# Patient Record
Sex: Female | Born: 1965 | Race: White | Hispanic: No | Marital: Married | Smoking: Never smoker
Health system: Southern US, Community
[De-identification: ages and names within clinical notes are randomized; demographics above are authoritative.]

## PROBLEM LIST (undated history)

## (undated) DIAGNOSIS — M6281 Muscle weakness (generalized): Secondary | ICD-10-CM

## (undated) DIAGNOSIS — K429 Umbilical hernia without obstruction or gangrene: Secondary | ICD-10-CM

## (undated) DIAGNOSIS — T7840XA Allergy, unspecified, initial encounter: Secondary | ICD-10-CM

## (undated) DIAGNOSIS — R42 Dizziness and giddiness: Secondary | ICD-10-CM

## (undated) DIAGNOSIS — E039 Hypothyroidism, unspecified: Secondary | ICD-10-CM

## (undated) DIAGNOSIS — J329 Chronic sinusitis, unspecified: Secondary | ICD-10-CM

## (undated) DIAGNOSIS — I8390 Asymptomatic varicose veins of unspecified lower extremity: Secondary | ICD-10-CM

## (undated) DIAGNOSIS — J42 Unspecified chronic bronchitis: Secondary | ICD-10-CM

## (undated) DIAGNOSIS — M79606 Pain in leg, unspecified: Secondary | ICD-10-CM

## (undated) DIAGNOSIS — R519 Headache, unspecified: Secondary | ICD-10-CM

## (undated) DIAGNOSIS — M436 Torticollis: Secondary | ICD-10-CM

## (undated) DIAGNOSIS — R202 Paresthesia of skin: Secondary | ICD-10-CM

## (undated) DIAGNOSIS — G40909 Epilepsy, unspecified, not intractable, without status epilepticus: Secondary | ICD-10-CM

## (undated) HISTORY — DX: Allergy, unspecified, initial encounter: T78.40XA

## (undated) HISTORY — DX: Epilepsy, unspecified, not intractable, without status epilepticus: G40.909

## (undated) HISTORY — DX: Chronic sinusitis, unspecified: J32.9

## (undated) HISTORY — DX: Umbilical hernia without obstruction or gangrene: K42.9

## (undated) HISTORY — DX: Asymptomatic varicose veins of unspecified lower extremity: I83.90

## (undated) HISTORY — DX: Dizziness and giddiness: R42

## (undated) HISTORY — DX: Unspecified chronic bronchitis: J42

## (undated) HISTORY — DX: Paresthesia of skin: R20.2

## (undated) HISTORY — DX: Headache, unspecified: R51.9

## (undated) HISTORY — DX: Hypothyroidism, unspecified: E03.9

## (undated) HISTORY — DX: Pain in leg, unspecified: M79.606

## (undated) HISTORY — DX: Muscle weakness (generalized): M62.81

## (undated) HISTORY — DX: Torticollis: M43.6

---
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
## 2014-07-03 DIAGNOSIS — Z8659 Personal history of other mental and behavioral disorders: Secondary | ICD-10-CM

## 2014-07-03 DIAGNOSIS — F411 Generalized anxiety disorder: Secondary | ICD-10-CM | POA: Insufficient documentation

## 2016-02-19 DIAGNOSIS — H524 Presbyopia: Secondary | ICD-10-CM | POA: Diagnosis not present

## 2016-05-21 DIAGNOSIS — R1011 Right upper quadrant pain: Secondary | ICD-10-CM | POA: Diagnosis not present

## 2016-05-21 DIAGNOSIS — R1013 Epigastric pain: Secondary | ICD-10-CM | POA: Diagnosis not present

## 2016-05-21 DIAGNOSIS — R110 Nausea: Secondary | ICD-10-CM | POA: Diagnosis not present

## 2016-05-21 DIAGNOSIS — K529 Noninfective gastroenteritis and colitis, unspecified: Secondary | ICD-10-CM | POA: Diagnosis not present

## 2016-05-21 DIAGNOSIS — K2901 Acute gastritis with bleeding: Secondary | ICD-10-CM | POA: Diagnosis not present

## 2016-05-21 DIAGNOSIS — K2900 Acute gastritis without bleeding: Secondary | ICD-10-CM | POA: Diagnosis not present

## 2016-05-21 DIAGNOSIS — R630 Anorexia: Secondary | ICD-10-CM | POA: Diagnosis not present

## 2016-05-21 DIAGNOSIS — E039 Hypothyroidism, unspecified: Secondary | ICD-10-CM | POA: Diagnosis not present

## 2019-05-17 ENCOUNTER — Ambulatory Visit (HOSPITAL_COMMUNITY)
Admission: RE | Admit: 2019-05-17 | Discharge: 2019-05-17 | Disposition: A | Discharge status: Home / Self Care | Payer: No Typology Code available for payment source | Source: Ambulatory Visit | Attending: Family | Admitting: Family

## 2019-05-17 ENCOUNTER — Other Ambulatory Visit: Payer: Self-pay

## 2019-05-17 ENCOUNTER — Other Ambulatory Visit (HOSPITAL_COMMUNITY): Payer: Self-pay | Admitting: Family

## 2019-05-17 ENCOUNTER — Other Ambulatory Visit: Payer: Self-pay | Admitting: Family

## 2019-05-17 ENCOUNTER — Encounter (HOSPITAL_COMMUNITY): Payer: Self-pay

## 2019-05-17 ENCOUNTER — Encounter (INDEPENDENT_AMBULATORY_CARE_PROVIDER_SITE_OTHER): Payer: Self-pay

## 2019-05-17 DIAGNOSIS — R519 Headache, unspecified: Secondary | ICD-10-CM

## 2019-05-17 DIAGNOSIS — R52 Pain, unspecified: Secondary | ICD-10-CM

## 2019-05-17 NOTE — Progress Notes (Signed)
VASCULAR LAB PRELIMINARY  PRELIMINARY  PRELIMINARY  PRELIMINARY  Bilateral lower extremity venous duplex completed.    Preliminary report:  See CV proc for preliminary results.   Arletta Lumadue, RVT 05/17/2019, 2:28 PM

## 2019-05-23 ENCOUNTER — Ambulatory Visit (HOSPITAL_COMMUNITY)
Admission: RE | Admit: 2019-05-23 | Discharge: 2019-05-23 | Disposition: A | Discharge status: Home / Self Care | Payer: No Typology Code available for payment source | Source: Ambulatory Visit | Attending: Family | Admitting: Family

## 2019-05-23 ENCOUNTER — Other Ambulatory Visit: Payer: Self-pay

## 2019-05-23 DIAGNOSIS — R51 Headache: Secondary | ICD-10-CM | POA: Diagnosis not present

## 2019-05-23 DIAGNOSIS — R519 Headache, unspecified: Secondary | ICD-10-CM

## 2019-05-25 ENCOUNTER — Encounter: Payer: Self-pay | Admitting: Vascular Surgery

## 2019-06-08 ENCOUNTER — Other Ambulatory Visit: Payer: Self-pay

## 2019-06-08 DIAGNOSIS — I83893 Varicose veins of bilateral lower extremities with other complications: Secondary | ICD-10-CM

## 2019-06-13 ENCOUNTER — Telehealth (HOSPITAL_COMMUNITY): Payer: Self-pay

## 2019-06-13 NOTE — Telephone Encounter (Signed)
The above patient or their representative was contacted and gave the following answers to these questions:         Do you have any of the following symptoms? No  Fever                    Cough                   Shortness of breath  Do  you have any of the following other symptoms?  No   muscle pain         vomiting,        diarrhea        rash         weakness        red eye        abdominal pain         bruising          bruising or bleeding              joint pain           severe headache    Have you been in contact with someone who was or has been sick in the past 2 weeks? No  Yes                 Unsure                         Unable to assess   Does the person that you were in contact with have any of the following symptoms? N/A   Cough         shortness of breath           muscle pain         vomiting,            diarrhea            rash            weakness           fever            red eye           abdominal pain           bruising  or  bleeding                joint pain                severe headache                COMMENTS OR ACTION PLAN FOR THIS PATIENT:         

## 2019-06-14 ENCOUNTER — Ambulatory Visit (INDEPENDENT_AMBULATORY_CARE_PROVIDER_SITE_OTHER): Payer: No Typology Code available for payment source | Admitting: Vascular Surgery

## 2019-06-14 ENCOUNTER — Other Ambulatory Visit: Payer: Self-pay

## 2019-06-14 ENCOUNTER — Ambulatory Visit (HOSPITAL_COMMUNITY)
Admission: RE | Admit: 2019-06-14 | Discharge: 2019-06-14 | Disposition: A | Discharge status: Home / Self Care | Payer: No Typology Code available for payment source | Source: Ambulatory Visit | Attending: Family | Admitting: Family

## 2019-06-14 ENCOUNTER — Encounter: Payer: Self-pay | Admitting: Vascular Surgery

## 2019-06-14 VITALS — BP 128/80 | HR 77 | Temp 97.3°F | Resp 18 | Ht 64.5 in | Wt 208.8 lb

## 2019-06-14 DIAGNOSIS — I83893 Varicose veins of bilateral lower extremities with other complications: Secondary | ICD-10-CM | POA: Insufficient documentation

## 2019-06-14 DIAGNOSIS — I83813 Varicose veins of bilateral lower extremities with pain: Secondary | ICD-10-CM | POA: Diagnosis not present

## 2019-06-14 NOTE — Progress Notes (Signed)
REASON FOR CONSULT:    Painful varicose veins bilaterally.  The consult is requested by Karen Krause  ASSESSMENT & PLAN:   PAINFUL VARICOSE VEINS BILATERALLY: This patient has CEAP C2 venous disease.  She has painful varicose veins of both lower extremities.  She has reflux in both great saphenous veins and dilated varicose veins in both legs that are under significant pressure.  We have discussed the importance of intermittent leg elevation and the proper positioning for this.  In addition I have given her a prescription for thigh-high compression stockings with a gradient of 20 to 30 mmHg.  I have encouraged her to avoid prolonged sitting and standing.  We then discussed the importance of exercise specifically walking and water aerobics.  We also discussed the importance of weight management.  I will plan on seeing her back in 3 months.  If she fails conservative treatment I think she would be a reasonable candidate for laser ablation of the great saphenous vein in the left proximal thigh along with stab phlebectomies on the left.  She could have staged laser ablation of the right great saphenous vein with stab phlebectomies also.  1 leg is not significantly more symptomatic than the other.  I will plan on seeing her back in 3 months.  She knows to call sooner if she has problems.   Karen Mayo, MD, FACS Beeper [phone] Office: [phone]   HPI:   Karen Krause is a pleasant 53 y.o. female, who is referred with painful varicose veins bilaterally.  I did review the notes from the referring office.  The patient was seen on 05/17/2019.  At that time the patient was having a headache.  The patient was also complaining of some right lower extremity pain.  This prompted a venous duplex scan which was done on 05/17/2019.  This showed no evidence of deep venous thrombosis on either side.  Patient was noted to have significant varicose veins bilaterally.  She was sent for vascular consultation.   The patient has a long history of varicose veins of both lower extremities.  She actually began having issues with varicose veins in her teens.  She was gardening a lot more in June and started having severe symptoms in her legs related to venous hypertension.  She describes aching pain burning and heaviness in her legs which is aggravated by sitting and standing and relieved somewhat with elevation.  She has not worn compression stockings.  She does not like to take ibuprofen.  She is unaware of any previous history of DVT.  She has had phlebitis in the past.  Past Medical History:  Diagnosis Date  . Allergy   . Chronic bronchitis ([place])   . Dizziness   . Frequent episodes of sinusitis   . Headache   . Hypothyroidism   . Leg pain    Bilateral  . Muscle weakness   . Seizure disorder ([place])   . Stiff neck   . Tingling sensation   . Umbilical hernia   . Varicose vein of leg   . Varicosities of leg     Family History  Problem Relation Age of Onset  . Dementia Mother   . Cancer Father   . Cancer Maternal Grandmother   . Dementia Paternal Grandmother     SOCIAL HISTORY: Social History   Socioeconomic History  . Marital status: Married    Spouse name: Not on file  . Number of children: Not on file  . Years of education: Not  on file  . Highest education level: Not on file  Occupational History  . Occupation: Arts development officerHome Maker  Social Needs  . Financial resource strain: Not on file  . Food insecurity    Worry: Not on file    Inability: Not on file  . Transportation needs    Medical: Not on file    Non-medical: Not on file  Tobacco Use  . Smoking status: Never Smoker  . Smokeless tobacco: Never Used  Substance and Sexual Activity  . Alcohol use: Never    Frequency: Never  . Drug use: Not on file  . Sexual activity: Not on file  Lifestyle  . Physical activity    Days per week: Not on file    Minutes per session: Not on file  . Stress: Not on file  Relationships  . Social  Musicianconnections    Talks on phone: Not on file    Gets together: Not on file    Attends religious service: Not on file    Active member of club or organization: Not on file    Attends meetings of clubs or organizations: Not on file    Relationship status: Not on file  . Intimate partner violence    Fear of current or ex partner: Not on file    Emotionally abused: Not on file    Physically abused: Not on file    Forced sexual activity: Not on file  Other Topics Concern  . Not on file  Social History Narrative  . Not on file    Allergies  Allergen Reactions  . Codeine     Severe headaches, hallucinations, fever    Current Outpatient Medications  Medication Sig Dispense Refill  . fluticasone (FLONASE) 50 MCG/ACT nasal spray      No current facility-administered medications for this visit.     REVIEW OF SYSTEMS:  [X]  denotes positive finding, [ ]  denotes negative finding Cardiac  Comments:  Chest pain or chest pressure: x   Shortness of breath upon exertion: x   Short of breath when lying flat: x   Irregular heart rhythm:        Vascular    Pain in calf, thigh, or hip brought on by ambulation: x   Pain in feet at night that wakes you up from your sleep:     Blood clot in your veins:    Leg swelling:  x       Pulmonary    Oxygen at home:    Productive cough:     Wheezing:         Neurologic    Sudden weakness in arms or legs:     Sudden numbness in arms or legs:     Sudden onset of difficulty speaking or slurred speech:    Temporary loss of vision in one eye:     Problems with dizziness:  x       Gastrointestinal    Blood in stool:     Vomited blood:         Genitourinary    Burning when urinating:     Blood in urine:        Psychiatric    Major depression:         Hematologic    Bleeding problems:    Problems with blood clotting too easily:        Skin    Rashes or ulcers:        Constitutional    Fever or  chills:     PHYSICAL EXAM:   Vitals:    06/14/19 1421  BP: 128/80  Pulse: 77  Resp: 18  Temp: (!) 97.3 F (36.3 C)  TempSrc: Temporal  SpO2: 99%  Weight: 208 lb 12.8 oz (94.7 kg)  Height: 5' 4.5" (1.638 m)    GENERAL: The patient is a well-nourished female, in no acute distress. The vital signs are documented above. CARDIAC: There is a regular rate and rhythm.  VASCULAR: I do not detect carotid bruits. She has palpable pedal pulses. VENOUS EXAM: She has varicose veins of both lower extremities which are under significant pressure.  RIGHT LEG:   LEFT LEG:   Posterior:   I did look at her saphenous veins myself with the SonoSite.  On the right side the vein was dilated beginning in the mid thigh to proximal third of the thigh.  At this level it was feeding a large cluster of varicose veins.  Below that the vein was small.  On the left side, the proximal saphenous vein was significantly dilated.  Looks like this could be cannulated in the proximal third of the thigh.  PULMONARY: There is good air exchange bilaterally without wheezing or rales. ABDOMEN: Soft and non-tender with normal pitched bowel sounds.  MUSCULOSKELETAL: There are no major deformities or cyanosis. NEUROLOGIC: No focal weakness or paresthesias are detected. SKIN: There are no ulcers or rashes noted. PSYCHIATRIC: The patient has a normal affect.  DATA:    VENOUS DUPLEX: I have independently interpreted her venous duplex scan today.  On the right side there is no evidence of deep venous thrombosis.  There is deep venous reflux involving the common femoral vein.  There is superficial venous reflux involving the right great saphenous vein which is dilated with diameters up to 0.62 cm in the thigh and 0.53 cm at the knee.  There is reflux in the right small saphenous vein with this vein is not significantly dilated.  On the left side there is no evidence of DVT.  There is deep venous reflux involving the common femoral vein.  There is superficial  venous reflux in the proximal great saphenous vein which is significantly dilated with diameters ranging from 0.64-0.74 cm.

## 2019-09-13 ENCOUNTER — Ambulatory Visit: Payer: No Typology Code available for payment source | Admitting: Vascular Surgery

## 2020-02-14 ENCOUNTER — Ambulatory Visit (INDEPENDENT_AMBULATORY_CARE_PROVIDER_SITE_OTHER): Payer: No Typology Code available for payment source | Admitting: Vascular Surgery

## 2020-02-14 ENCOUNTER — Encounter: Payer: Self-pay | Admitting: Vascular Surgery

## 2020-02-14 ENCOUNTER — Other Ambulatory Visit: Payer: Self-pay

## 2020-02-14 VITALS — BP 130/82 | HR 72 | Temp 97.8°F | Resp 18 | Ht 64.75 in | Wt 213.5 lb

## 2020-02-14 DIAGNOSIS — I83893 Varicose veins of bilateral lower extremities with other complications: Secondary | ICD-10-CM

## 2020-02-14 DIAGNOSIS — I83813 Varicose veins of bilateral lower extremities with pain: Secondary | ICD-10-CM

## 2020-02-14 NOTE — Progress Notes (Signed)
Patient name: Karen Krause MRN: [id number] DOB: 03-15-1966 Sex: female  REASON FOR VISIT:   Follow-up of painful varicose veins.  HPI:   Karen Krause is a pleasant 54 y.o. female who I saw in consultation on 06/14/2019.  She had painful varicose veins of both lower extremities.  She had CEAP C2 venous disease.  She had reflux in both great saphenous veins which were dilated and under significant pressure.  We discussed the importance of intermittent leg elevation, thigh-high compression stockings, and ibuprofen as needed for pain.  I felt that if she failed conservative treatment she would be a reasonable candidate for laser ablation of the great saphenous vein in the left proximal thigh along with stab phlebectomies on the left.  She could then have staged laser ablation of the right great saphenous vein with stab phlebectomies.  She comes in for routine follow-up visit.  Since I saw her last she is continuing to have significant pain in both lower extremities.  Her symptoms are aggravated by standing and are worse at the end of the day.  She has been elevating her legs.  She has been wearing her thigh-high stockings although they rolled down some and lately have become more uncomfortable.  She takes ibuprofen as needed for pain.  She did fall down the stairs and bruised her ribs but is recovering from this.  Past Medical History:  Diagnosis Date  . Allergy   . Chronic bronchitis ([place])   . Dizziness   . Frequent episodes of sinusitis   . Headache   . Hypothyroidism   . Leg pain    Bilateral  . Muscle weakness   . Seizure disorder ([place])   . Stiff neck   . Tingling sensation   . Umbilical hernia   . Varicose vein of leg   . Varicosities of leg     Family History  Problem Relation Age of Onset  . Dementia Mother   . Cancer Father   . Cancer Maternal Grandmother   . Dementia Paternal Grandmother     SOCIAL HISTORY: Social History   Tobacco Use  . Smoking status: Never  Smoker  . Smokeless tobacco: Never Used  Substance Use Topics  . Alcohol use: Never    Allergies  Allergen Reactions  . Codeine     Severe headaches, hallucinations, fever    Current Outpatient Medications  Medication Sig Dispense Refill  . fluticasone (FLONASE) 50 MCG/ACT nasal spray      No current facility-administered medications for this visit.    REVIEW OF SYSTEMS:  [X]  denotes positive finding, [ ]  denotes negative finding Cardiac  Comments:  Chest pain or chest pressure:    Shortness of breath upon exertion:    Short of breath when lying flat:    Irregular heart rhythm:        Vascular    Pain in calf, thigh, or hip brought on by ambulation:    Pain in feet at night that wakes you up from your sleep:     Blood clot in your veins:    Leg swelling:         Pulmonary    Oxygen at home:    Productive cough:     Wheezing:         Neurologic    Sudden weakness in arms or legs:     Sudden numbness in arms or legs:     Sudden onset of difficulty speaking or slurred speech:    Temporary  loss of vision in one eye:     Problems with dizziness:         Gastrointestinal    Blood in stool:     Vomited blood:         Genitourinary    Burning when urinating:     Blood in urine:        Psychiatric    Major depression:         Hematologic    Bleeding problems:    Problems with blood clotting too easily:        Skin    Rashes or ulcers:        Constitutional    Fever or chills:     PHYSICAL EXAM:   Vitals:   02/14/20 0847  BP: 130/82  Pulse: 72  Resp: 18  Temp: 97.8 F (36.6 C)  TempSrc: Temporal  SpO2: 99%  Weight: 213 lb 8 oz (96.8 kg)  Height: 5' 4.75" (1.645 m)    GENERAL: The patient is a well-nourished female, in no acute distress. The vital signs are documented above. CARDIAC: There is a regular rate and rhythm.  VASCULAR: She has significant varicose veins in her thighs and calves bilaterally as documented on her previous note.  These  are under significantly elevated pressures. I looked at both great saphenous veins myself with the SonoSite.  She has incompetence in the proximal thigh on both sides and the veins are significantly dilated.  These feed these large clusters of varicose veins bilaterally. PULMONARY: There is good air exchange bilaterally without wheezing or rales. ABDOMEN: Soft and non-tender with normal pitched bowel sounds.  MUSCULOSKELETAL: There are no major deformities or cyanosis. NEUROLOGIC: No focal weakness or paresthesias are detected. SKIN: There are no ulcers or rashes noted. PSYCHIATRIC: The patient has a normal affect.  DATA:    No new data  MEDICAL ISSUES:   PAINFUL VARICOSE VEINS BILATERALLY: This patient has continued significant painful varicose veins despite conservative treatment.  I think she is a good candidate for staged laser ablation of both great saphenous veins with stab phlebectomies.  We would start on the left side where I would ablate the vein to the junction of the proximal and middle third of the thigh.  She would require greater than 20 stab phlebectomies on the left.  When she is recovered from that she would be a candidate for laser ablation of the right great saphenous vein again down to the junction of the proximal and middle third of the thigh with greater than 20 stab phlebectomies.  We will schedule this in the near future.  I have discussed the indications for endovenous laser ablation of the right GSV, that is to lower the pressure in the veins and potentially help relieve the symptoms from venous hypertension. I have also discussed alternative options including conservative treatment with leg elevation, compression therapy, exercise, avoiding prolonged sitting and standing, and weight management. I have discussed the potential complications of the procedure, including, but not limited to: bleeding, bruising, leg swelling, nerve injury, skin burns, significant pain from  phlebitis, deep venous thrombosis, or failure of the vein to close.  I have also explained that venous insufficiency is a chronic disease, and that the patient is at risk for recurrent varicose veins in the future.  All of the patient's questions were encouraged and answered. They are agreeable to proceed.   I have discussed with the patient the indications for stab phlebectomy.  I have explained to  the patient that that will have small scars from the stab incisions.  I explained that the other risks include leg swelling, bruising, bleeding, and phlebitis.  All the patient's questions were encouraged and answered and they are agreeable to proceed.   Waverly Ferrari [facility] [phone]

## 2020-02-20 ENCOUNTER — Other Ambulatory Visit: Payer: Self-pay | Admitting: *Deleted

## 2020-02-20 DIAGNOSIS — I83813 Varicose veins of bilateral lower extremities with pain: Secondary | ICD-10-CM

## 2020-03-13 ENCOUNTER — Encounter: Payer: Self-pay | Admitting: Vascular Surgery

## 2020-03-14 IMAGING — CT CT HEAD WITHOUT CONTRAST
3 series · 15 of 47 positions shown, 18 images · non-contrast
Comparison: July 03, 2014

CLINICAL DATA: Headache and left frontal region numbness

EXAM:
CT HEAD WITHOUT CONTRAST
TECHNIQUE: Contiguous axial images were obtained from the base of the skull
through the vertex without intravenous contrast.

[Series 3: head 5.0 h30s · axial · 0.43mm/px · z∈[-110,+25]mm · 9 of 33 slices shown, 12 images]
[im 3/33  brain]
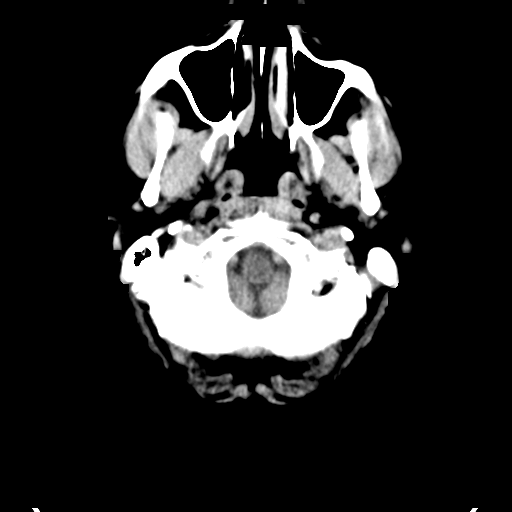
[im 3/33  bone]
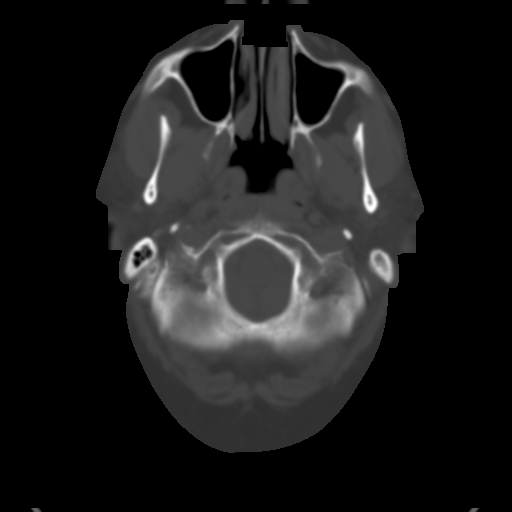
[im 6/33  brain]
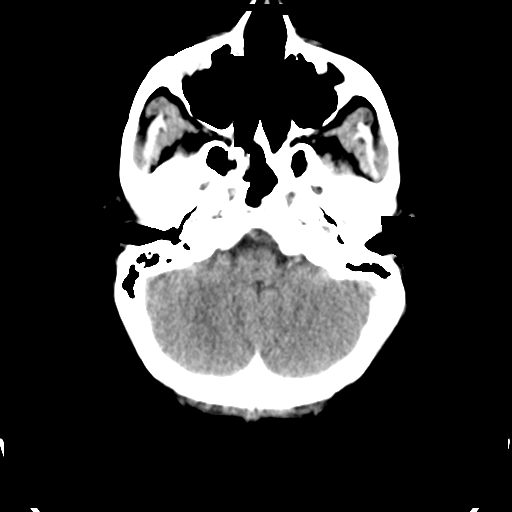
[im 9/33  brain]
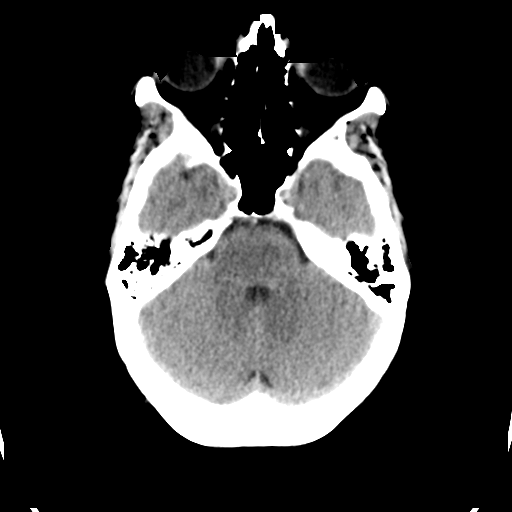
[im 13/33  brain]
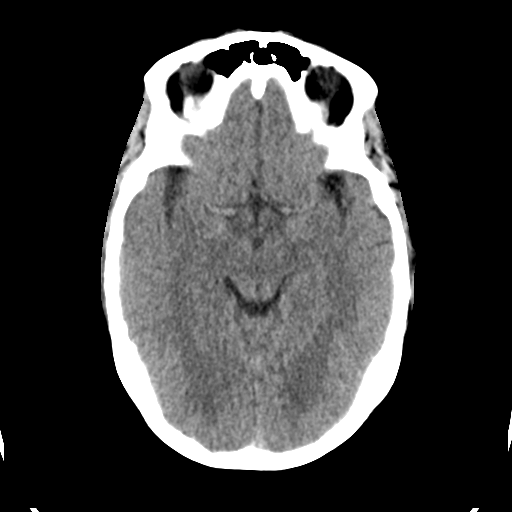
[im 17/33  brain]
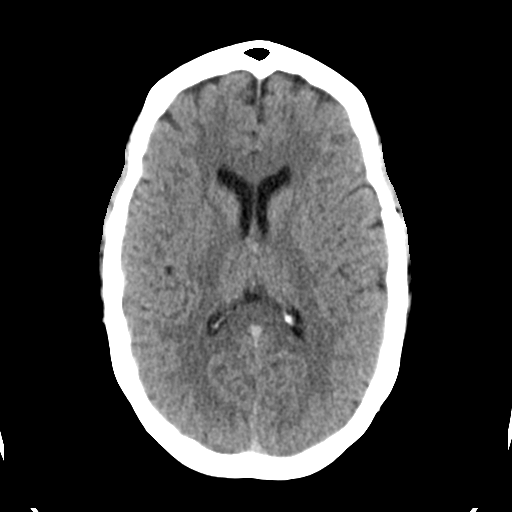
[im 17/33  bone]
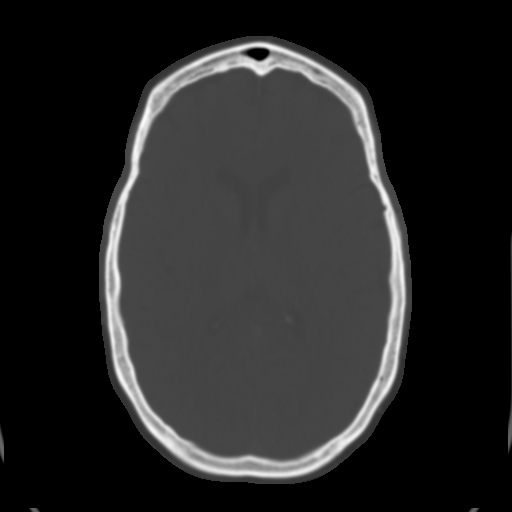
[im 20/33  brain]
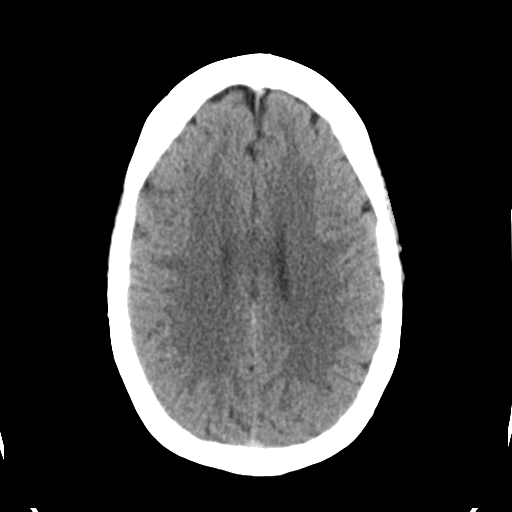
[im 24/33  brain]
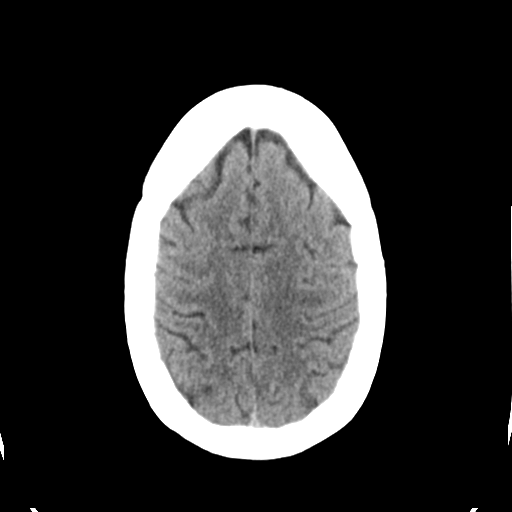
[im 27/33  brain]
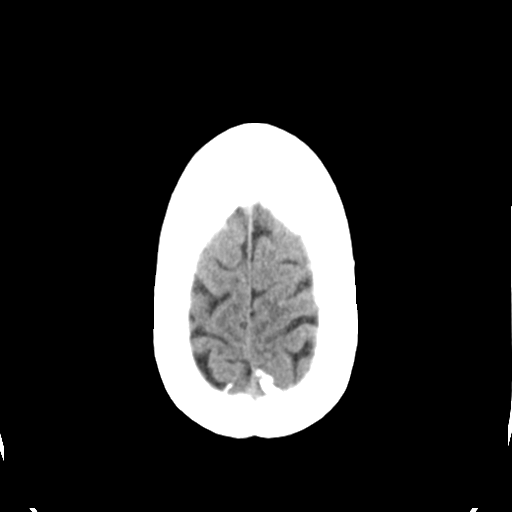
[im 30/33  brain]
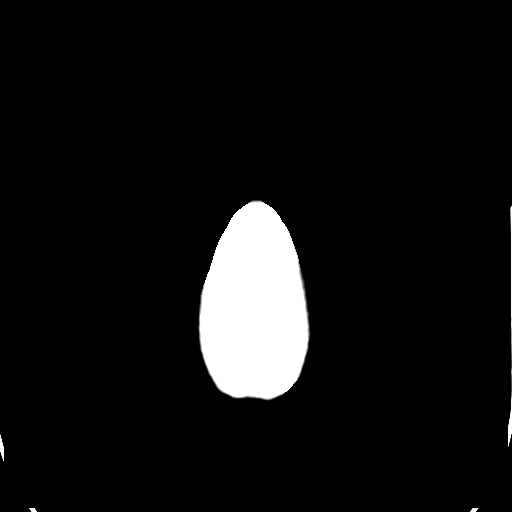
[im 30/33  bone]
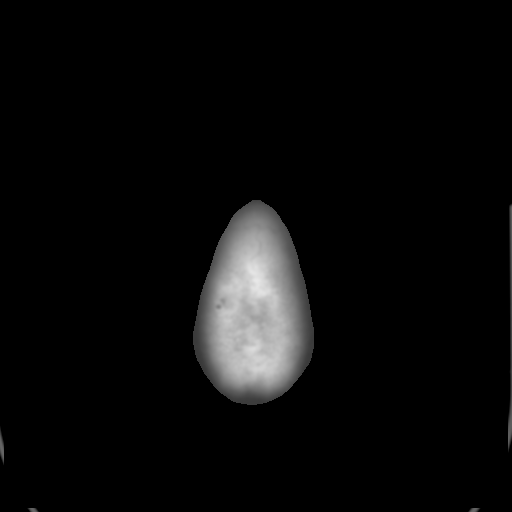

[Series 5: head 3.0 mpr cor · coronal · 0.31mm/px · 3 of 67 slices shown]
[im 23/67  brain]
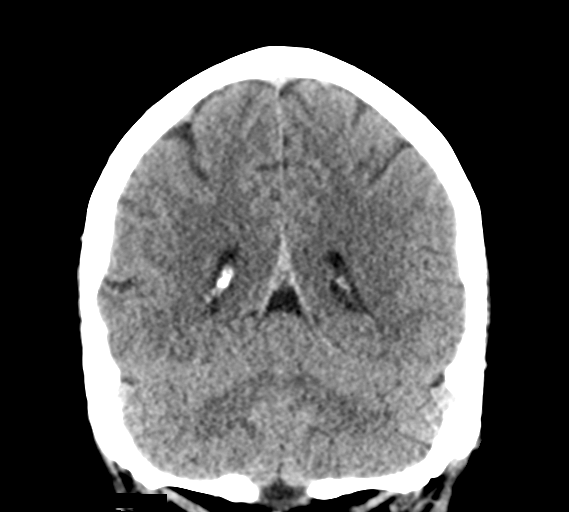
[im 30/67  brain]
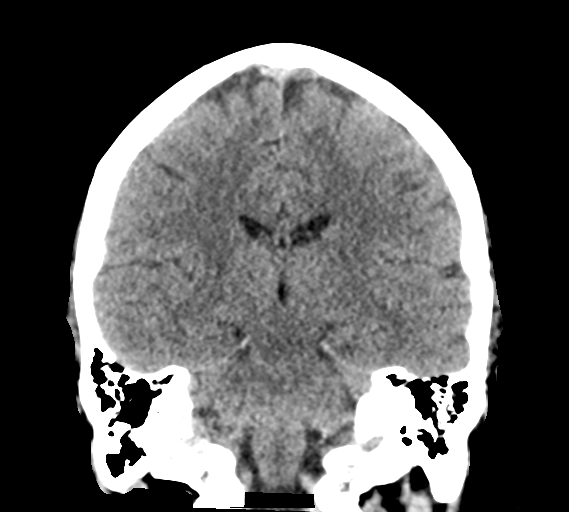
[im 37/67  brain]
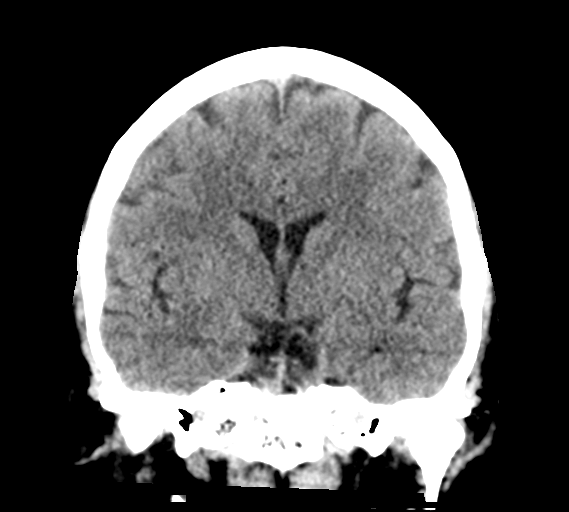

[Series 6: head 3.0 mpr sag · sagittal · 0.31mm/px · 3 of 55 slices shown]
[im 19/55  brain]
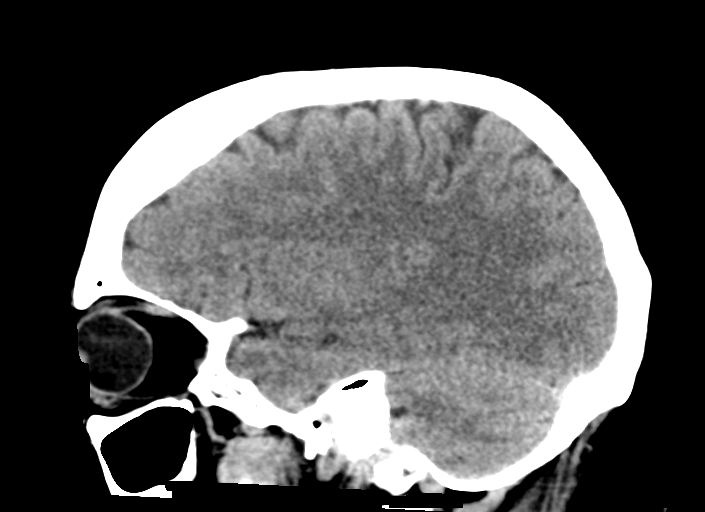
[im 28/55  brain]
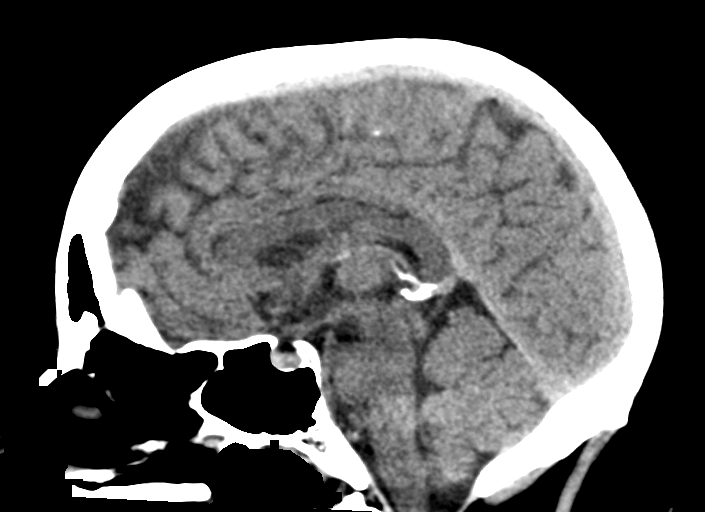
[im 37/55  brain]
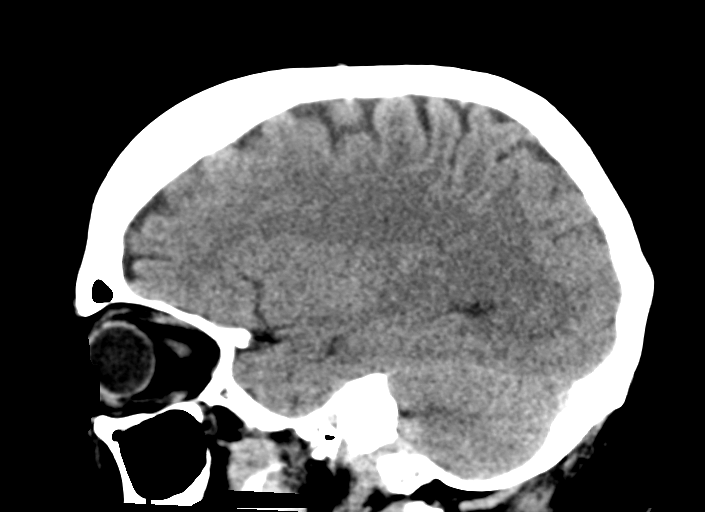

[15 of 47 positions shown; findings below may reference images not displayed]

FINDINGS: Brain: Ventricles are normal in size and configuration. There is no
intracranial mass, hemorrhage, extra-axial fluid collection, or
midline shift. Brain parenchyma appears unremarkable. No evident
acute infarct.

Vascular: No hyperdense vessel.  No evident vascular calcification.

Skull: Bony calvarium appears intact.

Sinuses/Orbits: There is mild mucosal thickening in several ethmoid
air cells. Other visualized paranasal sinuses are clear. Orbits
appear symmetric bilaterally.

Other: Mastoid air cells are clear.
IMPRESSION: Slight ethmoid sinus disease.  Study otherwise unremarkable.

## 2020-03-20 ENCOUNTER — Other Ambulatory Visit: Payer: Self-pay

## 2020-03-20 ENCOUNTER — Ambulatory Visit (INDEPENDENT_AMBULATORY_CARE_PROVIDER_SITE_OTHER): Payer: No Typology Code available for payment source | Admitting: Vascular Surgery

## 2020-03-20 ENCOUNTER — Encounter: Payer: Self-pay | Admitting: Vascular Surgery

## 2020-03-20 VITALS — BP 146/92 | HR 79 | Temp 97.6°F | Resp 16 | Ht 64.5 in | Wt 213.0 lb

## 2020-03-20 DIAGNOSIS — I83813 Varicose veins of bilateral lower extremities with pain: Secondary | ICD-10-CM

## 2020-03-20 DIAGNOSIS — I8393 Asymptomatic varicose veins of bilateral lower extremities: Secondary | ICD-10-CM

## 2020-03-20 HISTORY — PX: ENDOVENOUS ABLATION SAPHENOUS VEIN W/ LASER: SUR449

## 2020-03-20 NOTE — Progress Notes (Signed)
     Laser Ablation Procedure    Date: 03/20/2020   Karen Krause DOB:[birth year]  Consent signed: Yes Cari Caraway MD    Surgeon:   Procedure: Laser Ablation: left Greater Saphenous Vein  BP (!) 146/92 (BP Location: Right Arm, Patient Position: Sitting, Cuff Size: Normal)   Pulse 79   Temp 97.6 F (36.4 C) (Temporal)   Resp 16   Ht 5' 4.5" (1.638 m)   Wt 213 lb (96.6 kg)   SpO2 99%   BMI 36.00 kg/m   Tumescent Anesthesia: 475  cc 0.9% NaCl with 50 cc Lidocaine HCL 1%  and 15 cc 8.4% NaHCO3  Local Anesthesia: 6 cc Lidocaine HCL and NaHCO3 (ratio 2:1)  7 watts continuous mode Total energy: 441.6 Joules  Total Time 63 seconds Treatment Length 11 cm  Laser Fiber Ref. # [id number]      Lot # [id number]   Stab Phlebectomy: >20 Sites: Thigh and Calf  Patient tolerated procedure well  Notes: Patient wore face mask.  All staff members wore facial masks and facial shields/goggles.    Description of Procedure:  After marking the course of the secondary varicosities, the patient was placed on the operating table in the supine position, and the left leg was prepped and draped in sterile fashion.   Local anesthetic was administered and under ultrasound guidance the saphenous vein was accessed with a micro needle and guide wire; then the mirco puncture sheath was placed.  A guide wire was inserted saphenofemoral junction , followed by a 5 french sheath.  The position of the sheath and then the laser fiber below the junction was confirmed using the ultrasound.  Tumescent anesthesia was administered along the course of the saphenous vein using ultrasound guidance. The patient was placed in Trendelenburg position and protective laser glasses were placed on patient and staff, and the laser was fired at 7 watts continuous mode for a total of 441.6 joules.   For stab phlebectomies, local anesthetic was administered at the previously marked varicosities, and tumescent anesthesia was administered  around the vessels.  Greater than 20 stab wounds were made using the tip of an 11 blade. And using the vein hook, the phlebectomies were performed using a hemostat to avulse the varicosities.  Adequate hemostasis was achieved.     Steri strips were applied to the stab wounds and ABD pads and thigh high compression stockings were applied.  Ace wrap bandages were applied over the phlebectomy sites and at the top of the saphenofemoral junction. Blood loss was less than 15 cc.  Discharge instructions reviewed with patient and hardcopy of discharge instructions given to patient to take home. The patient ambulated out of the operating room having tolerated the procedure well.

## 2020-03-20 NOTE — Progress Notes (Signed)
   Patient name: Karen Krause MRN: [id number] DOB: [birth year] Sex: female  REASON FOR VISIT: For laser ablation of the left great saphenous vein with greater than 20 stab phlebectomies.  HPI: Karen Krause is a 54 y.o. female who had presented with painful varicose veins bilaterally.  She had failed conservative treatment and was felt to be a candidate for staged laser ablation of the left great saphenous veins with stab phlebectomies.  She comes in today for treatment of the left side.  Current Outpatient Medications  Medication Sig Dispense Refill  . fluticasone (FLONASE) 50 MCG/ACT nasal spray      No current facility-administered medications for this visit.    PHYSICAL EXAM: Vitals:   03/20/20 0840  BP: (!) 146/92  Pulse: 79  Resp: 16  Temp: 97.6 F (36.4 C)  TempSrc: Temporal  SpO2: 99%  Weight: 213 lb (96.6 kg)  Height: 5' 4.5" (1.638 m)   Body mass index is 36 kg/m.  PROCEDURE: Laser ablation left great saphenous vein with greater than 20 stab phlebectomies  TECHNIQUE: The patient was taken to the exam room and the dilated veins in the left leg were marked with the patient standing.  The patient was then placed supine.  After careful positioning I examined the left great saphenous vein with the SonoSite and I felt that I could cannulate this in the junction of the proximal and middle thigh.  The left leg was prepped and draped in usual sterile fashion.  Under ultrasound guidance, after the skin was anesthetized, I cannulated the saphenous vein and directed a micropuncture wire into the vein.  I then placed a micropuncture sheath.  I then advanced the J-wire to just below the saphenofemoral junction.  The sheath was then advanced over the wire and positioned approximately 2 cm distal to the saphenofemoral junction.  The laser fiber was then positioned at the end of the sheath and then the sheath retracted.  Next tumescent anesthesia was administered circumferentially around the  vein.  The patient was then placed in Trendelenburg.  Endovenous laser ablation was performed of the left great saphenous vein from approximately 2-1/2 cm distal to the saphenofemoral junction to the proximal to mid thigh.  441 J of energy were used to treat a length of approximately 11 cm.  Next tumescent anesthesia was administered at all the marked areas where she had dilated varicose veins.  Using approximately 25 small stab incisions with an 11 blade the vein was hoped and then retracted above the skin and then grasped with a hemostat and bluntly excised.  Pressure was held for hemostasis.  Pressure dressing was then applied.  The patient tolerated the procedure well.  She will return in 2 weeks for follow-up duplex.  Waverly Ferrari [facility] [place] [phone]

## 2020-04-03 ENCOUNTER — Ambulatory Visit (INDEPENDENT_AMBULATORY_CARE_PROVIDER_SITE_OTHER): Payer: Self-pay | Admitting: Vascular Surgery

## 2020-04-03 ENCOUNTER — Ambulatory Visit (HOSPITAL_COMMUNITY)
Admission: RE | Admit: 2020-04-03 | Discharge: 2020-04-03 | Disposition: A | Discharge status: Home / Self Care | Payer: No Typology Code available for payment source | Source: Ambulatory Visit | Attending: Vascular Surgery | Admitting: Vascular Surgery

## 2020-04-03 ENCOUNTER — Encounter: Payer: Self-pay | Admitting: Vascular Surgery

## 2020-04-03 ENCOUNTER — Other Ambulatory Visit: Payer: Self-pay

## 2020-04-03 VITALS — BP 136/91 | HR 80 | Temp 97.5°F | Resp 16

## 2020-04-03 DIAGNOSIS — I83813 Varicose veins of bilateral lower extremities with pain: Secondary | ICD-10-CM | POA: Diagnosis not present

## 2020-04-03 DIAGNOSIS — Z48812 Encounter for surgical aftercare following surgery on the circulatory system: Secondary | ICD-10-CM

## 2020-04-03 DIAGNOSIS — I8393 Asymptomatic varicose veins of bilateral lower extremities: Secondary | ICD-10-CM

## 2020-04-03 NOTE — Progress Notes (Signed)
   Patient name: Karen Krause MRN: [id number] DOB: 02-17-1966 Sex: female  REASON FOR VISIT: Follow-up after endovenous laser ablation of the left great saphenous vein with greater than 20 stab phlebectomies.  HPI: Karen Krause is a 54 y.o. 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  She has CEAP C2 venous disease.  She failed conservative treatment and I felt she was a candidate for stage laser ablation and stab phlebectomies.  On 03/20/2020 she underwent laser ablation of the left anterior sensory saphenous vein in the proximal thigh with greater than 20 stab phlebectomies.  This was a fairly short segment of vein that was treated.  I treated 11 cm of vein.  Overall she has been doing well.  She is had mild bruising.  She had some issues with the dressing bunching up on her but once this was resolved her leg feels better.  She has been resuming her normal activities.  She is been elevating her legs and wearing her thigh-high compression stocking.  I felt that once she is recovered from the procedure on the left leg she would be a candidate for laser ablation of the right great saphenous vein down to the junction of the proximal and middle third of the thigh with greater than 20 stab phlebectomies.  This is scheduled for May 27.  VENOUS INTERVENTIONS:  03/20/2020: EVLA of Left AASV and > 20 stabs 04/10/20: Schedule for laser ablation of the right great saphenous vein with greater than 20 stabs.   Current Outpatient Medications  Medication Sig Dispense Refill  . fluticasone (FLONASE) 50 MCG/ACT nasal spray      No current facility-administered medications for this visit.   REVIEW OF SYSTEMS: Arly.Keller ] denotes positive finding; [  ] denotes negative finding  CARDIOVASCULAR:  [ ]  chest pain   [ ]  dyspnea on exertion  [ ]  leg swelling  CONSTITUTIONAL:  [ ]  fever   [ ]  chills  PHYSICAL EXAM: Vitals:   04/03/20 1040  BP: (!) 136/91  Pulse: 80  Resp: 16  Temp: (!)  97.5 F (36.4 C)  TempSrc: Temporal  SpO2: 98%     GENERAL: The patient is a well-nourished female, in no acute distress. The vital signs are documented above. CARDIOVASCULAR: There is a regular rate and rhythm. PULMONARY: There is good air exchange bilaterally without wheezing or rales. VASCULAR: Her incisions are healing nicely.  She has mild bruising.  DATA:  VENOUS DUPLEX: I have independently interpreted her venous duplex scan today.  There is no evidence of DVT.  The left anterior sensory saphenous vein is closed from 2.5 cm distal to the saphenofemoral junction to the mid thigh.  MEDICAL ISSUES:  STATUS POST LASER ABLATION LEFT GREAT SAPHENOUS VEIN AND GREATER THAN 20 STABS: Patient is doing well status post laser ablation of the left great saphenous vein with greater than 20 stabs.  At this point I think she can wear knee-high stocking on the left.  She will continue to elevate her legs.  She is scheduled for staged laser ablation of the right great saphenous vein with greater than 20 stabs on 04/10/2020.  When she comes in for her follow-up visit after her right leg she does want to be fitted for knee-high compression stockings with a gradient of 15 to 20 mmHg.  [facility] [place] [phone]

## 2020-04-10 ENCOUNTER — Encounter: Payer: Self-pay | Admitting: Vascular Surgery

## 2020-04-10 ENCOUNTER — Ambulatory Visit (INDEPENDENT_AMBULATORY_CARE_PROVIDER_SITE_OTHER): Payer: No Typology Code available for payment source | Admitting: Vascular Surgery

## 2020-04-10 ENCOUNTER — Other Ambulatory Visit: Payer: Self-pay

## 2020-04-10 VITALS — BP 130/84 | HR 75 | Temp 97.9°F | Resp 16 | Ht 64.5 in | Wt 213.0 lb

## 2020-04-10 DIAGNOSIS — I83813 Varicose veins of bilateral lower extremities with pain: Secondary | ICD-10-CM | POA: Diagnosis not present

## 2020-04-10 HISTORY — PX: ENDOVENOUS ABLATION SAPHENOUS VEIN W/ LASER: SUR449

## 2020-04-10 NOTE — Progress Notes (Signed)
     Laser Ablation Procedure    Date: 04/10/2020   Karen Krause DOB:03-07-1966  Consent signed: Yes    Surgeon: Cari Caraway MD   Procedure: Laser Ablation: right Greater Saphenous Vein  BP 130/84 (BP Location: Left Arm, Patient Position: Sitting, Cuff Size: Normal)   Pulse 75   Temp 97.9 F (36.6 C) (Temporal)   Resp 16   Ht 5' 4.5" (1.638 m)   Wt 213 lb (96.6 kg)   SpO2 100%   BMI 36.00 kg/m   Tumescent Anesthesia: 700 cc 0.9% NaCl with 50 cc Lidocaine HCL 1%  and 15 cc 8.4% NaHCO3  Local Anesthesia: 8 cc Lidocaine HCL and NaHCO3 (ratio 2:1)  7 watts continuous mode     Total energy: 1211 Joules   Total time: 173 Seconds Treatment Length 27 cm  Laser Fiber Ref. # [id number]    Lot # [id number]   Stab Phlebectomy: >20 Sites: Thigh and Calf  Patient tolerated procedure well  Notes: Patient wore face mask.  All staff members wore facial masks and facial shields/goggles.    Description of Procedure:  After marking the course of the secondary varicosities, the patient was placed on the operating table in the supine position, and the right leg was prepped and draped in sterile fashion.   Local anesthetic was administered and under ultrasound guidance the saphenous vein was accessed with a micro needle and guide wire; then the mirco puncture sheath was placed.  A guide wire was inserted saphenofemoral junction , followed by a 5 french sheath.  The position of the sheath and then the laser fiber below the junction was confirmed using the ultrasound.  Tumescent anesthesia was administered along the course of the saphenous vein using ultrasound guidance. The patient was placed in Trendelenburg position and protective laser glasses were placed on patient and staff, and the laser was fired at 7 watts continuous mode for a total of 1211 joules.   For stab phlebectomies, local anesthetic was administered at the previously marked varicosities, and tumescent anesthesia was administered  around the vessels.  Greater than 20 stab wounds were made using the tip of an 11 blade. And using the vein hook, the phlebectomies were performed using a hemostat to avulse the varicosities.  Adequate hemostasis was achieved.     Steri strips were applied to the stab wounds and ABD pads and thigh high compression stockings were applied.  Ace wrap bandages were applied over the phlebectomy sites and at the top of the saphenofemoral junction. Blood loss was less than 15 cc.  Discharge instructions reviewed with patient and hardcopy of discharge instructions given to patient to take home. The patient was taken to car in wheelchair out of the operating room having tolerated the procedure well.

## 2020-04-10 NOTE — Progress Notes (Signed)
   Patient name: Karen Krause MRN: [id number] DOB: [birth year] Sex: female  REASON FOR VISIT: For laser ablation of right great saphenous vein with greater than 20 stabs  HPI: Karen Krause is a 54 y.o. 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  She had CEAP C2 venous disease.  She underwent laser ablation of the left anterior accessory saphenous vein with greater than 20 stabs on 03/20/2020.  She returns for laser ablation of the right great saphenous vein.  When I previously saw her that looked like we could ablate the vein from the saphenofemoral junction to the junction of the proximal and middle third of the right thigh.  Current Outpatient Medications  Medication Sig Dispense Refill  . fluticasone (FLONASE) 50 MCG/ACT nasal spray      No current facility-administered medications for this visit.    PHYSICAL EXAM: Vitals:   04/10/20 1034  BP: 130/84  Pulse: 75  Resp: 16  Temp: 97.9 F (36.6 C)  TempSrc: Temporal  SpO2: 100%  Weight: 213 lb (96.6 kg)  Height: 5' 4.5" (1.638 m)    PROCEDURE: Laser ablation right great saphenous vein and greater than 20 stabs  TECHNIQUE: The patient was taken to the exam room.  I marked the dilated veins with the patient standing.  The patient was then placed supine.  I examined the right great saphenous vein with the SonoSite and I felt that I could cannulate it at the junction of the middle and distal third of the thigh.  Right leg was prepped and draped in usual sterile fashion.  Under ultrasound guidance, after the skin was anesthetized, I cannulated the great saphenous vein at the junction of the middle and distal third of the thigh just below the takeoff of a large tributary.  The micropuncture sheath was introduced over a wire.  I then advanced the J-wire to just below the saphenofemoral junction.  I then advanced the 45 cm sheath over the wire and positioned this below the saphenofemoral junction.  The wire and  dilator were removed.  I then positioned the laser fiber the end of the sheath and the sheath was then retracted.  This was positioned approximately 2-1/2 cm distal to the saphenofemoral junction.  Tumescent anesthesia was then administered circumferentially around the vein from the point of cannulation up to the saphenofemoral junction.  The patient was then placed in Trendelenburg.  Laser ablation was performed of the right great saphenous vein from 2-1/2 cm distal to the saphenofemoral junction to the mid to distal thigh.  Next all the areas with marked varicose veins were anesthetized with tumescent anesthesia.  Approximately 25 small stab incisions were made over these areas of the veins were retracted into the wound and excised bluntly.  Pressure was held for hemostasis.  Sterile dressing was applied.  Patient tolerated the procedure well.  She will return in 2 weeks for follow-up duplex.  Waverly Ferrari [facility] [place] [phone]

## 2020-04-17 ENCOUNTER — Encounter: Payer: Self-pay | Admitting: Vascular Surgery

## 2020-04-17 ENCOUNTER — Other Ambulatory Visit: Payer: Self-pay

## 2020-04-17 ENCOUNTER — Ambulatory Visit (HOSPITAL_COMMUNITY)
Admission: RE | Admit: 2020-04-17 | Discharge: 2020-04-17 | Disposition: A | Discharge status: Home / Self Care | Payer: No Typology Code available for payment source | Source: Ambulatory Visit | Attending: Vascular Surgery | Admitting: Vascular Surgery

## 2020-04-17 ENCOUNTER — Ambulatory Visit (INDEPENDENT_AMBULATORY_CARE_PROVIDER_SITE_OTHER): Payer: Self-pay | Admitting: Vascular Surgery

## 2020-04-17 VITALS — BP 130/80 | HR 68 | Temp 97.0°F | Resp 14

## 2020-04-17 DIAGNOSIS — I83813 Varicose veins of bilateral lower extremities with pain: Secondary | ICD-10-CM | POA: Insufficient documentation

## 2020-04-17 DIAGNOSIS — Z48812 Encounter for surgical aftercare following surgery on the circulatory system: Secondary | ICD-10-CM

## 2020-04-17 NOTE — Progress Notes (Signed)
   Patient name: Karen Krause MRN: [id number] DOB: March 01, 1966 Sex: female  REASON FOR VISIT: Follow-up after laser ablation of the right great saphenous vein with greater than 20 stabs.  HPI: Karen Krause is a 54 y.o. 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  She had CEAP C2 venous disease.  She underwent laser ablation of the left anterior accessory saphenous vein with greater than 20 stabs on 03/20/2020.  On 04/10/2020 she underwent laser ablation of the right great saphenous vein from 2-1/2 cm distal to the saphenofemoral junction to the mid to distal thigh.  She also had greater than 20 stab phlebectomies.  She comes in for follow-up visit.  The patient is doing well with no specific complaints.  She had minimal leg swelling.  She has been elevating her legs and wearing her thigh-high compression stocking.  Current Outpatient Medications  Medication Sig Dispense Refill  . fluticasone (FLONASE) 50 MCG/ACT nasal spray      No current facility-administered medications for this visit.   REVIEW OF SYSTEMS: Arly.Keller ] denotes positive finding; [  ] denotes negative finding  CARDIOVASCULAR:  [ ]  chest pain   [ ]  dyspnea on exertion  [ ]  leg swelling  CONSTITUTIONAL:  [ ]  fever   [ ]  chills  PHYSICAL EXAM: Vitals:   04/17/20 1041  BP: 130/80  Pulse: 68  Resp: 14  Temp: (!) 97 F (36.1 C)  TempSrc: Temporal  SpO2: 99%   GENERAL: The patient is a well-nourished female, in no acute distress. The vital signs are documented above. CARDIOVASCULAR: There is a regular rate and rhythm. PULMONARY: There is good air exchange bilaterally without wheezing or rales. VASCULAR: Her incisions are healing nicely.  She has minimal bruising.  She has no significant leg swelling on either side.  DATA:  VENOUS DUPLEX: I have independently interpreted her venous duplex scan today.  There is no evidence of DVT.  The right great saphenous vein is closed from 2 and half  centimeters distal to the saphenofemoral junction to the mid to distal thigh.  MEDICAL ISSUES:  PAINFUL VARICOSE VEINS BILATERALLY: The patient has now undergone staged ablation of the left anterior sensory saphenous vein with greater than 20 stabs and now most recently the right great saphenous vein with greater than 20 stabs.  She will wear her thigh-high stocking for another week.  Then it may be more practical to try a knee-high stocking.  We will get her her fitted for a 15-20 stocking.  Her son is a and we have had a long discussion about plant-based nutrition.  I will see her back as needed.  [facility] [place] [phone]

## 2021-12-01 ENCOUNTER — Other Ambulatory Visit (HOSPITAL_COMMUNITY): Payer: Self-pay

## 2021-12-02 ENCOUNTER — Other Ambulatory Visit (HOSPITAL_COMMUNITY): Payer: Self-pay

## 2021-12-02 MED ORDER — SULFAMETHOXAZOLE-TRIMETHOPRIM 800-160 MG PO TABS: ORAL_TABLET | ORAL | 1 refills | Status: DC

## 2021-12-02 MED ORDER — LIOTHYRONINE SODIUM 5 MCG PO TABS
ORAL_TABLET | ORAL | 3 refills | Status: AC
  Filled 2021-12-02: qty 30, 30d supply, fill #0
  Filled 2021-12-29: qty 30, 30d supply, fill #1
  Filled 2022-01-28: qty 30, 30d supply, fill #2

## 2021-12-02 MED ORDER — PROGESTERONE 200 MG PO CAPS
ORAL_CAPSULE | ORAL | 3 refills | Status: AC
  Filled 2021-12-02: qty 30, 30d supply, fill #0
  Filled 2021-12-29: qty 30, 30d supply, fill #1

## 2021-12-03 ENCOUNTER — Other Ambulatory Visit (HOSPITAL_COMMUNITY): Payer: Self-pay

## 2021-12-25 ENCOUNTER — Other Ambulatory Visit (HOSPITAL_COMMUNITY): Payer: Self-pay

## 2021-12-29 ENCOUNTER — Other Ambulatory Visit (HOSPITAL_COMMUNITY): Payer: Self-pay

## 2021-12-30 ENCOUNTER — Other Ambulatory Visit (HOSPITAL_COMMUNITY): Payer: Self-pay

## 2022-01-01 ENCOUNTER — Other Ambulatory Visit (HOSPITAL_COMMUNITY): Payer: Self-pay

## 2022-01-28 ENCOUNTER — Other Ambulatory Visit (HOSPITAL_COMMUNITY): Payer: Self-pay

## 2022-06-03 ENCOUNTER — Encounter (HOSPITAL_COMMUNITY): Payer: Self-pay | Admitting: Internal Medicine

## 2022-06-03 ENCOUNTER — Other Ambulatory Visit: Payer: Self-pay

## 2022-06-03 ENCOUNTER — Inpatient Hospital Stay (HOSPITAL_COMMUNITY)
Admission: AD | Admit: 2022-06-03 | Discharge: 2022-06-09 | DRG: 392 | Disposition: A | Discharge status: Home / Self Care | Payer: No Typology Code available for payment source | Source: Other Acute Inpatient Hospital | Attending: Student | Admitting: Student

## 2022-06-03 DIAGNOSIS — R338 Other retention of urine: Secondary | ICD-10-CM | POA: Diagnosis present

## 2022-06-03 DIAGNOSIS — M542 Cervicalgia: Secondary | ICD-10-CM | POA: Diagnosis not present

## 2022-06-03 DIAGNOSIS — Z8659 Personal history of other mental and behavioral disorders: Secondary | ICD-10-CM

## 2022-06-03 DIAGNOSIS — K5900 Constipation, unspecified: Secondary | ICD-10-CM

## 2022-06-03 DIAGNOSIS — E039 Hypothyroidism, unspecified: Secondary | ICD-10-CM | POA: Diagnosis present

## 2022-06-03 DIAGNOSIS — R339 Retention of urine, unspecified: Secondary | ICD-10-CM | POA: Diagnosis present

## 2022-06-03 DIAGNOSIS — Z888 Allergy status to other drugs, medicaments and biological substances status: Secondary | ICD-10-CM

## 2022-06-03 DIAGNOSIS — R102 Pelvic and perineal pain: Secondary | ICD-10-CM | POA: Diagnosis present

## 2022-06-03 DIAGNOSIS — Z91410 Personal history of adult physical and sexual abuse: Secondary | ICD-10-CM

## 2022-06-03 DIAGNOSIS — K623 Rectal prolapse: Secondary | ICD-10-CM | POA: Diagnosis not present

## 2022-06-03 DIAGNOSIS — Z79899 Other long term (current) drug therapy: Secondary | ICD-10-CM

## 2022-06-03 DIAGNOSIS — K5981 Ogilvie syndrome: Principal | ICD-10-CM | POA: Diagnosis present

## 2022-06-03 DIAGNOSIS — Z87898 Personal history of other specified conditions: Secondary | ICD-10-CM

## 2022-06-03 DIAGNOSIS — Z885 Allergy status to narcotic agent status: Secondary | ICD-10-CM | POA: Diagnosis not present

## 2022-06-03 DIAGNOSIS — K429 Umbilical hernia without obstruction or gangrene: Secondary | ICD-10-CM | POA: Diagnosis present

## 2022-06-03 DIAGNOSIS — K5909 Other constipation: Secondary | ICD-10-CM | POA: Diagnosis present

## 2022-06-03 DIAGNOSIS — F411 Generalized anxiety disorder: Secondary | ICD-10-CM | POA: Insufficient documentation

## 2022-06-03 LAB — CBC
HCT: 42.6 % (ref 36.0–46.0)
Hemoglobin: 13.8 g/dL (ref 12.0–15.0)
MCH: 30.2 pg (ref 26.0–34.0)
MCHC: 32.4 g/dL (ref 30.0–36.0)
MCV: 93.2 fL (ref 80.0–100.0)
Platelets: 212 10*3/uL (ref 150–400)
RBC: 4.57 MIL/uL (ref 3.87–5.11)
RDW: 13.7 % (ref 11.5–15.5)
WBC: 6 10*3/uL (ref 4.0–10.5)
nRBC: 0 % (ref 0.0–0.2)

## 2022-06-03 LAB — COMPREHENSIVE METABOLIC PANEL
ALT: 26 U/L (ref 0–44)
AST: 22 U/L (ref 15–41)
Albumin: 3.6 g/dL (ref 3.5–5.0)
Alkaline Phosphatase: 80 U/L (ref 38–126)
Anion gap: 5 (ref 5–15)
BUN: <5 mg/dL — ABNORMAL LOW (ref 6–20)
CO2: 21 mmol/L — ABNORMAL LOW (ref 22–32)
Calcium: 9 mg/dL (ref 8.9–10.3)
Chloride: 116 mmol/L — ABNORMAL HIGH (ref 98–111)
Creatinine, Ser: 0.45 mg/dL (ref 0.44–1.00)
GFR, Estimated: >60 mL/min (ref >60)
Glucose, Bld: 110 mg/dL — ABNORMAL HIGH (ref 70–99)
Potassium: 3.3 mmol/L — ABNORMAL LOW (ref 3.5–5.1)
Sodium: 142 mmol/L (ref 135–145)
Total Bilirubin: 0.8 mg/dL (ref 0.3–1.2)
Total Protein: 6.8 g/dL (ref 6.5–8.1)

## 2022-06-03 MED ORDER — ENOXAPARIN SODIUM 40 MG/0.4ML IJ SOSY
40.0000 mg | PREFILLED_SYRINGE | INTRAMUSCULAR | Status: DC
Start: 2022-06-04 — End: 2022-06-09
  Administered 2022-06-04 – 2022-06-08 (×4): 40 mg via SUBCUTANEOUS
  Filled 2022-06-03 (×6): qty 0.4

## 2022-06-03 MED ORDER — MORPHINE SULFATE (PF) 2 MG/ML IV SOLN
2.0000 mg | INTRAVENOUS | Status: DC | PRN
  Administered 2022-06-04: 4 mg via INTRAVENOUS
  Administered 2022-06-04 – 2022-06-07 (×11): 2 mg via INTRAVENOUS
  Filled 2022-06-03 (×15): qty 1
  Filled 2022-06-03: qty 2

## 2022-06-03 MED ORDER — LACTATED RINGERS IV SOLN: INTRAVENOUS | Status: DC

## 2022-06-03 MED ORDER — ACETAMINOPHEN 325 MG PO TABS
650.0000 mg | ORAL_TABLET | Freq: Four times a day (QID) | ORAL | Status: DC | PRN
  Administered 2022-06-04 – 2022-06-08 (×7): 650 mg via ORAL
  Filled 2022-06-03 (×8): qty 2

## 2022-06-03 MED ORDER — ONDANSETRON HCL 4 MG/2ML IJ SOLN: 4.0000 mg | Freq: Four times a day (QID) | INTRAMUSCULAR | Status: DC | PRN

## 2022-06-03 MED ORDER — ONDANSETRON HCL 4 MG PO TABS: 4.0000 mg | ORAL_TABLET | Freq: Four times a day (QID) | ORAL | Status: DC | PRN

## 2022-06-03 MED ORDER — ACETAMINOPHEN 650 MG RE SUPP: 650.0000 mg | Freq: Four times a day (QID) | RECTAL | Status: DC | PRN

## 2022-06-03 NOTE — Plan of Care (Signed)
  Problem: Education: Goal: Knowledge of General Education information will improve Description Including pain rating scale, medication(s)/side effects and non-pharmacologic comfort measures Outcome: Progressing   

## 2022-06-04 ENCOUNTER — Observation Stay (HOSPITAL_COMMUNITY): Payer: No Typology Code available for payment source

## 2022-06-04 DIAGNOSIS — F411 Generalized anxiety disorder: Secondary | ICD-10-CM | POA: Diagnosis present

## 2022-06-04 DIAGNOSIS — Z888 Allergy status to other drugs, medicaments and biological substances status: Secondary | ICD-10-CM | POA: Diagnosis not present

## 2022-06-04 DIAGNOSIS — R338 Other retention of urine: Secondary | ICD-10-CM | POA: Diagnosis not present

## 2022-06-04 DIAGNOSIS — K5909 Other constipation: Secondary | ICD-10-CM | POA: Diagnosis present

## 2022-06-04 DIAGNOSIS — M542 Cervicalgia: Secondary | ICD-10-CM | POA: Diagnosis present

## 2022-06-04 DIAGNOSIS — R339 Retention of urine, unspecified: Secondary | ICD-10-CM | POA: Diagnosis present

## 2022-06-04 DIAGNOSIS — Z79899 Other long term (current) drug therapy: Secondary | ICD-10-CM | POA: Diagnosis not present

## 2022-06-04 DIAGNOSIS — K5900 Constipation, unspecified: Secondary | ICD-10-CM | POA: Diagnosis not present

## 2022-06-04 DIAGNOSIS — Z91410 Personal history of adult physical and sexual abuse: Secondary | ICD-10-CM | POA: Diagnosis not present

## 2022-06-04 DIAGNOSIS — K623 Rectal prolapse: Secondary | ICD-10-CM

## 2022-06-04 DIAGNOSIS — R102 Pelvic and perineal pain: Secondary | ICD-10-CM | POA: Diagnosis present

## 2022-06-04 DIAGNOSIS — Z885 Allergy status to narcotic agent status: Secondary | ICD-10-CM | POA: Diagnosis not present

## 2022-06-04 DIAGNOSIS — Z8659 Personal history of other mental and behavioral disorders: Secondary | ICD-10-CM

## 2022-06-04 DIAGNOSIS — K5981 Ogilvie syndrome: Principal | ICD-10-CM

## 2022-06-04 DIAGNOSIS — Z87898 Personal history of other specified conditions: Secondary | ICD-10-CM

## 2022-06-04 DIAGNOSIS — K429 Umbilical hernia without obstruction or gangrene: Secondary | ICD-10-CM | POA: Diagnosis present

## 2022-06-04 DIAGNOSIS — N8110 Cystocele, unspecified: Secondary | ICD-10-CM

## 2022-06-04 DIAGNOSIS — E039 Hypothyroidism, unspecified: Secondary | ICD-10-CM | POA: Diagnosis present

## 2022-06-04 LAB — HIV ANTIBODY (ROUTINE TESTING W REFLEX): HIV Screen 4th Generation wRfx: NONREACTIVE

## 2022-06-04 MED ORDER — POTASSIUM CHLORIDE CRYS ER 20 MEQ PO TBCR
40.0000 meq | EXTENDED_RELEASE_TABLET | Freq: Once | ORAL | Status: AC
  Administered 2022-06-04: 40 meq via ORAL
  Filled 2022-06-04: qty 2

## 2022-06-04 MED ORDER — LIOTHYRONINE SODIUM 5 MCG PO TABS
5.0000 ug | ORAL_TABLET | Freq: Every day | ORAL | Status: DC
Start: 2022-06-04 — End: 2022-06-09
  Administered 2022-06-05 – 2022-06-09 (×4): 5 ug via ORAL
  Filled 2022-06-04 (×6): qty 1

## 2022-06-04 MED ORDER — ORAL CARE MOUTH RINSE
15.0000 mL | OROMUCOSAL | Status: DC
  Administered 2022-06-04 – 2022-06-09 (×18): 15 mL via OROMUCOSAL

## 2022-06-04 MED ORDER — IOHEXOL 350 MG/ML SOLN
100.0000 mL | Freq: Once | INTRAVENOUS | Status: AC | PRN
  Administered 2022-06-04: 100 mL via INTRAVENOUS

## 2022-06-04 MED ORDER — SODIUM CHLORIDE (PF) 0.9 % IJ SOLN
INTRAMUSCULAR | Status: AC
  Filled 2022-06-04: qty 50

## 2022-06-04 MED ORDER — POLYETHYLENE GLYCOL 3350 17 G PO PACK
17.0000 g | PACK | Freq: Two times a day (BID) | ORAL | Status: DC
  Administered 2022-06-04 (×2): 17 g via ORAL
  Filled 2022-06-04 (×3): qty 1

## 2022-06-04 NOTE — Assessment & Plan Note (Addendum)
No recent medication changes to explain. Distention of cecum and ascending colon to max diameter 8.8cm on CT at OSH. Pain controlled with morphine. 1. NPO 2. IVF 3. Morphine PRN pain control 4. Message sent to Dr. Marca Ancona, GI to see tomorrow. 5. Replace K

## 2022-06-04 NOTE — Assessment & Plan Note (Signed)
Neck pain and headache since straining to have BM PTA to ED on Wed. CTA C/A/P at OSH ruled out aortic disection. Kidneys look okay on BMP this AM 1. will get CTA neck to r/o carotid dissection.

## 2022-06-04 NOTE — Assessment & Plan Note (Addendum)
Sounds like she had rectal prolapse following the straining to have BM on 7/14, but she was able to resolve this manually.  No rectal prolapse since presentation to ED.

## 2022-06-04 NOTE — Progress Notes (Signed)
  Transition of Care (TOC) Screening Note   Patient Details  Name: Karen Krause Date of Birth: [birth year]   Transition of Care [facility]) CM/SW Contact:    Amada Jupiter, LCSW Phone Number: 06/04/2022, 3:20 PM    Transition of Care Department [facility]) has reviewed patient and no TOC needs have been identified at this time. We will continue to monitor patient advancement through interdisciplinary progression rounds. If new patient transition needs arise, please place a TOC consult.
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
## 2022-06-04 NOTE — Consult Note (Signed)
Referring Provider: Almon Hercules, MD Primary Care Physician:  Alinda Deem, MD Primary Gastroenterologist:  Dr.   Jaquita Rector for Consultation:  Colonic pseudo-obstruction  HPI: Karen Krause is a 56 y.o. female with medical history significant of hypothyroidism, spinal cord injury in 2011.  Patient initially presented to [facility] ED, transferred to [facility] for availability of GI consult.  Prior to presentation spent a while on toilet with what she thought was severe constipation. Had small hard BM with what sounds like rectal and vaginal prolapse though she "pushed this back in" manually (no longer having prolapse).   In ED patient found to have acute urinary retention and possible colonic pseudo-obstruction based on CT findings. Not on opiates at baseline, though being given morphine for abdominal pain. Cecum/ascending colon dilation max diameter 8.8cm on CT, pain controlled with morphine.  Patient seen and examined sitting in bedside chair this morning.  She states she has had constipation her whole life, which especially has worsened after surgeries.  She takes magnesium citrate and psyllium husk at home to manage this and states she follows a strict diet, but a couple weeks ago started having less frequent stools.  This was followed by bouts of pencil thin stools/diarrhea.  Then a few days ago patient states she could tell she had a blockage, pushed for an hour to have a bowel movement and felt a ripping pain beginning in her bladder and radiating up her chest into her neck.  Also noticed a rectal bulge which she had to push back in.  After all this she had only passed a very small amount of hard stool.  This episode was followed by body shaking and pain throughout her torso.  She denies seeing any blood or having any dark black stools.  She does have a history of hemorrhoids which have bled in the past.  Following this episode patient had difficulty urinating, had loss of appetite and noticed  early satiety even though she felt hungry.  She came to the ED due to pelvic/lower abdominal pain and spasms.  States pain was initially 10/10, currently 3/10 with morphine.  States she has recurrence of spasm like pain when not receiving pain medication.  She has been passing a small amount of gas but denies any bowel movement since admission.  She does note improvement in her abdominal distention today and is overall feeling a little better but having some discomfort due to urinary catheter.  Patient states she had a colonoscopy 10+ years ago in [facility] with Dr. Claudine Mouton.  Reports that even though she completed her prep and was passing clear liquid prior to the procedure, she was found to have rockhard stool throughout her colon and reports that the procedure took a long time because of this.  She denies any polyps or diverticulosis, and was told to have a repeat in 10 years.  She is due for repeat colon.  Patient reports that she has history of fluctuation in weight but since 1996 has lost almost 200 pounds.  Reports about 40 pounds intentional weight loss in the last 4 to 5 months on strict diet.  Has some nausea associated with current pain but denies any vomiting.  Denies any MI/stroke history and is not on any blood thinners; however, she does have history of venous insufficiency and has had surgeries on her bilateral lower extremities due to severe varicose veins, reports she had DVTs during pregnancy.  Patient states her mother side of the family has GI issues  but denies any family history of GI cancers.  Denies any alcohol use, smoking, marijuana, other drug use.  Past Medical History:  Diagnosis Date   Allergy    Chronic bronchitis (HCC)    Dizziness    Frequent episodes of sinusitis    Headache    Hypothyroidism    Leg pain    Bilateral   Muscle weakness    Seizure disorder (HCC)    Stiff neck    Tingling sensation    Umbilical hernia    Varicose vein of leg     Varicosities of leg     Past Surgical History:  Procedure Laterality Date   ENDOVENOUS ABLATION SAPHENOUS VEIN W/ LASER Left 03/20/2020   endovenous laser ablation left greater saphenous vein and stab phlebectomy > 20 incisons left leg by Cari Caraway MD    ENDOVENOUS ABLATION SAPHENOUS VEIN W/ LASER Right 04/10/2020   endovenous laser ablation right greater saphenous vein with stab phlebectomy > 20 incisions right leg by Cari Caraway MD    TONSILLECTOMY AND ADENOIDECTOMY  1974    Prior to Admission medications   Medication Sig Start Date End Date Taking? Authorizing Provider  liothyronine (CYTOMEL) 5 MCG tablet Take 1 tablet by mouth every morning Patient taking differently: Take 5 mcg by mouth daily. 11/04/21  Yes   OVER THE COUNTER MEDICATION Place 1 tablet under the tongue daily. Medication: Estrogen po SL   Yes [provider]  progesterone (PROMETRIUM) 200 MG capsule Take 1 capsule by mouth at bedtime Patient taking differently: Take 200 mg by mouth daily. 11/04/21  Yes   sulfamethoxazole-trimethoprim (BACTRIM DS) 800-160 MG tablet Take 1 tablet by mouth daily for 10 days Patient not taking: Reported on 06/03/2022 11/17/21       Scheduled Meds:  enoxaparin (LOVENOX) injection  40 mg Subcutaneous Q24H   mouth rinse  15 mL Mouth Rinse 4 times per day   sodium chloride (PF)       Continuous Infusions:  lactated ringers 100 mL/hr at 06/04/22 0111   PRN Meds:.acetaminophen **OR** acetaminophen, morphine injection, ondansetron **OR** ondansetron (ZOFRAN) IV, sodium chloride (PF)  Allergies as of 06/03/2022 - Review Complete 06/03/2022  Allergen Reaction Noted   Codeine Nausea And Vomiting and Other (See Comments) 07/03/2014   Diphenhydramine hcl Other (See Comments) 05/21/2016    Family History  Problem Relation Age of Onset   Dementia Mother    Cancer Father    Cancer Maternal Grandmother    Dementia Paternal Grandmother     Social History   Socioeconomic  History   Marital status: Married    Spouse name: Not on file   Number of children: Not on file   Years of education: Not on file   Highest education level: Not on file  Occupational History   Occupation: Home Maker  Tobacco Use   Smoking status: Never   Smokeless tobacco: Never  Substance and Sexual Activity   Alcohol use: Never   Drug use: Not on file   Sexual activity: Not on file  Other Topics Concern   Not on file  Social History Narrative   Not on file   Social Determinants of Health   Financial Resource Strain: Not on file  Food Insecurity: Not on file  Transportation Needs: Not on file  Physical Activity: Not on file  Stress: Not on file  Social Connections: Not on file  Intimate Partner Violence: Not on file    Review of Systems: Review of Systems  Constitutional:  Positive for chills and weight loss (intentional). Negative for fever.  HENT:  Negative for sore throat.   Eyes:  Negative for discharge and redness.  Respiratory:  Negative for cough, hemoptysis, shortness of breath and stridor.   Cardiovascular:  Negative for palpitations and leg swelling.  Gastrointestinal:  Positive for abdominal pain, constipation and nausea. Negative for blood in stool, diarrhea, heartburn, melena and vomiting.  Genitourinary:  Positive for dysuria. Negative for hematuria.  Musculoskeletal:  Negative for falls and joint pain.  Skin:  Negative for itching and rash.  Neurological:  Negative for dizziness, seizures and loss of consciousness.  Endo/Heme/Allergies:  Negative for environmental allergies and polydipsia.  Psychiatric/Behavioral:  Negative for memory loss. The patient does not have insomnia.      Physical Exam: Physical Exam Vitals reviewed.  Constitutional:      General: She is not in acute distress.    Appearance: Normal appearance.  HENT:     Head: Normocephalic and atraumatic.     Right Ear: External ear normal.     Left Ear: External ear normal.     Nose:  Nose normal.     Mouth/Throat:     Mouth: Mucous membranes are moist.     Pharynx: Oropharynx is clear.  Eyes:     General: No scleral icterus.    Extraocular Movements: Extraocular movements intact.  Cardiovascular:     Rate and Rhythm: Normal rate and regular rhythm.     Pulses: Normal pulses.     Heart sounds: Normal heart sounds.  Pulmonary:     Effort: Pulmonary effort is normal.     Breath sounds: Normal breath sounds.  Abdominal:     General: There is no distension.     Palpations: Abdomen is soft. There is no mass.     Tenderness: There is abdominal tenderness. There is no guarding.  Musculoskeletal:     Cervical back: Normal range of motion and neck supple.     Right lower leg: No edema.     Left lower leg: No edema.  Skin:    General: Skin is warm and dry.  Neurological:     General: No focal deficit present.     Mental Status: She is alert and oriented to person, place, and time.  Psychiatric:        Mood and Affect: Mood normal.        Behavior: Behavior normal.      Vital signs: Vitals:   06/04/22 0512 06/04/22 0924  BP: (!) 107/59 132/71  Pulse: 72 63  Resp: 16 17  Temp: 97.6 F (36.4 C) 98.1 F (36.7 C)  SpO2: 98% 100%   Last BM Date : 06/02/22 (small after giving herself enema)    GI:  Lab Results: Recent Labs    06/03/22 2233  WBC 6.0  HGB 13.8  HCT 42.6  PLT 212   BMET Recent Labs    06/03/22 2233  NA 142  K 3.3*  CL 116*  CO2 21*  GLUCOSE 110*  BUN <5*  CREATININE 0.45  CALCIUM 9.0   LFT Recent Labs    06/03/22 2233  PROT 6.8  ALBUMIN 3.6  AST 22  ALT 26  ALKPHOS 80  BILITOT 0.8   PT/INR No results for input(s): "LABPROT", "INR" in the last 72 hours.   Studies/Results: CT ANGIO NECK W OR WO CONTRAST  Result Date: 06/04/2022 CLINICAL DATA:  Possible carotid artery dissection. EXAM: CT ANGIOGRAPHY NECK TECHNIQUE: Multidetector CT  imaging of the neck was performed using the standard protocol during bolus  administration of intravenous contrast. Multiplanar CT image reconstructions and MIPs were obtained to evaluate the vascular anatomy. Carotid stenosis measurements (when applicable) are obtained utilizing NASCET criteria, using the distal internal carotid diameter as the denominator. RADIATION DOSE REDUCTION: This exam was performed according to the departmental dose-optimization program which includes automated exposure control, adjustment of the mA and/or kV according to patient size and/or use of iterative reconstruction technique. CONTRAST:  OMNIPAQUE IOHEXOL 350 MG/ML SOLN COMPARISON:  None Available. FINDINGS: Aortic arch: Standard branching. Imaged portion shows no evidence of aneurysm or dissection. No significant stenosis of the major arch vessel origins. Minimal calcific atherosclerosis. Right carotid system: No evidence of dissection, stenosis (50% or greater) or occlusion. Left carotid system: No evidence of dissection, stenosis (50% or greater) or occlusion. Vertebral arteries: Codominant. No evidence of dissection, stenosis (50% or greater) or occlusion. Skeleton: Negative Other neck: Unremarkable Upper chest: Clear IMPRESSION: No evidence of dissection, occlusion or stenosis of the carotid or vertebral arteries. Electronically Signed   By: Deatra Robinson M.D.   On: 06/04/2022 02:42    Impression: Colonic pseudo-obstruction - Labs 06/03/2022 show potassium 3.3, chloride 116, otherwise unremarkable - Hemoglobin 13.8 and no leukocytosis - CTA 06/03/2022 negative for aortic dissection.  Shows prominent distention of the cecum/ascending colon dilated to 8.8 cm with layering fluid and gradual tapering of bowel caliber to normal at the level of the splenic flexure.  Colonic pseudoobstruction could be considered in the appropriate clinical setting. - Patient is receiving morphine, fluids, potassium chloride, Zofran, Lovenox - Currently NPO - Catheter in place due to urinary retention - Last  colonoscopy 10 years ago in [facility] with Dr. Claudine Mouton.  Records unavailable but patient states there was hard stool found throughout her colon despite completing full prep.  No polyps or diverticulosis per patient was told to have a 10-year recall.  Due for repeat.  Plan: Based on CT done at [facility] ED.  Passing some gas but no bowel movement since admission.  Notes abdominal distention has improved.  History of chronic constipation. Continue bowel rest, NPO for now.  Continue IV fluids and pain control. Try to ambulate if possible. Patient due for repeat colonoscopy, could consider doing this as inpatient pending clinical improvement or could be done outpatient. We will discuss further management with Dr. Dulce Sellar who will see patient later today. [facility] will follow.    LOS: 1 day   Berdine Dance  PA-C 06/04/2022, 9:33 AM  Contact #  [phone]

## 2022-06-04 NOTE — Assessment & Plan Note (Signed)
Sounds like patient has some degree of neurogenic bladder at baseline ever since the 2011 spinal cord injury.  But never had urinary retention or need for catheter until Wed. 1. Foley in place 2. Renal fxn looks good on BMP today 3. May wish to consult urology in AM vs f/u outpatient

## 2022-06-04 NOTE — H&P (Signed)
History and Physical    Patient: Karen Krause UYQ:[id number] DOB: 05-Nov-1966 DOA: 06/03/2022 DOS: the patient was seen and examined on 06/04/2022 PCP: Alinda Deem, MD  Patient coming from: Outside Hospital  Chief Complaint: Urinary retention, colonic pseudo obstruction. HPI: Karen Krause is a 56 y.o. female with medical history significant of hypothyroidism, spinal cord injury in 2011.  At baseline following the 2011 injury pt has what sounds like some degree of neurogenic bladder (doesn't feel the urge to go to bathroom until bladder is full), but she has never previously required catheter use since the injury.  ~6 days ago, pt with feeling of severe constipation.  Sat on toilet for an hour straining to go to bathroom.  Attempted numerous laxatives (PO and PR), only had small BM.  While straining, felt tearing sensation in neck, chest, abd.  Since that time, pt with no further BM, also urinary retention.  Pt also reported what sounded like rectal prolapse after the straining episode, though she manually reduced this herself, not present on ED evaluation.  In to ED at OSH [facility]) on Wed evening, Thursday AM.  ED at OSH: Foley placed with over 1200cc UOP. Given morphine Improvement in abd pain.  CTA chest/abd/pelvis dissection study done: No aortic dissection. Dilation of cecum and ascending colon to max of 8.8cm.  Decompression of remainder of colon.  Concern for Pseudo obstruction.  Pt transferred to [facility] for availability of GI consult.  Pt still having neck pain and headache.  Abd pain improved after morphine, but still requiring.  Does have 100lbs wt loss recently that was intentional (result of strict diet).  Review of Systems: As mentioned in the history of present illness. All other systems reviewed and are negative. Past Medical History:  Diagnosis Date   Allergy    Chronic bronchitis (HCC)    Dizziness    Frequent episodes of sinusitis    Headache     Hypothyroidism    Leg pain    Bilateral   Muscle weakness    Seizure disorder (HCC)    Stiff neck    Tingling sensation    Umbilical hernia    Varicose vein of leg    Varicosities of leg    Past Surgical History:  Procedure Laterality Date   ENDOVENOUS ABLATION SAPHENOUS VEIN W/ LASER Left 03/20/2020   endovenous laser ablation left greater saphenous vein and stab phlebectomy > 20 incisons left leg by Cari Caraway MD    ENDOVENOUS ABLATION SAPHENOUS VEIN W/ LASER Right 04/10/2020   endovenous laser ablation right greater saphenous vein with stab phlebectomy > 20 incisions right leg by Cari Caraway MD    TONSILLECTOMY AND ADENOIDECTOMY  1974   Social History:  reports that she has never smoked. She has never used smokeless tobacco. She reports that she does not drink alcohol. No history on file for drug use.  Allergies  Allergen Reactions   Codeine Nausea And Vomiting and Other (See Comments)    Severe headaches, hallucinations, fever Hallucinations   Diphenhydramine Hcl Other (See Comments)    States she has a very hard time waking up with even a small amount of benadryl     Family History  Problem Relation Age of Onset   Dementia Mother    Cancer Father    Cancer Maternal Grandmother    Dementia Paternal Grandmother     Prior to Admission medications   Medication Sig Start Date End Date Taking? Authorizing Provider  liothyronine (CYTOMEL) 5 MCG tablet  Take 1 tablet by mouth every morning Patient taking differently: Take 5 mcg by mouth daily. 11/04/21  Yes   OVER THE COUNTER MEDICATION Place 1 tablet under the tongue daily. Medication: Estrogen po SL   Yes [provider]  progesterone (PROMETRIUM) 200 MG capsule Take 1 capsule by mouth at bedtime Patient taking differently: Take 200 mg by mouth daily. 11/04/21  Yes   sulfamethoxazole-trimethoprim (BACTRIM DS) 800-160 MG tablet Take 1 tablet by mouth daily for 10 days Patient not taking: Reported on 06/03/2022  11/17/21       Physical Exam: Vitals:   06/03/22 2139  BP: (!) 147/97  Pulse: 85  Resp: 18  Temp: 97.9 F (36.6 C)  TempSrc: Oral  SpO2: 100%   Constitutional: NAD, calm, comfortable Eyes: PERRL, lids and conjunctivae normal ENMT: Mucous membranes are moist. Posterior pharynx clear of any exudate or lesions.Normal dentition.  Neck: normal, supple, no masses, no thyromegaly Respiratory: clear to auscultation bilaterally, no wheezing, no crackles. Normal respiratory effort. No accessory muscle use.  Cardiovascular: Regular rate and rhythm, no murmurs / rubs / gallops. No extremity edema. 2+ pedal pulses. No carotid bruits.  Abdomen: Diffusely TTP, mild distention.  Soft umbilical hernia. Musculoskeletal: no clubbing / cyanosis. No joint deformity upper and lower extremities. Good ROM, no contractures. Normal muscle tone.  Skin: no rashes, lesions, ulcers. No induration Neurologic: CN 2-12 grossly intact. Sensation intact, DTR normal. Strength 5/5 in all 4.  Psychiatric: Normal judgment and insight. Alert and oriented x 3. Normal mood.   Data Reviewed:    CBC    Component Value Date/Time   WBC 6.0 06/03/2022 2233   RBC 4.57 06/03/2022 2233   HGB 13.8 06/03/2022 2233   HCT 42.6 06/03/2022 2233   PLT 212 06/03/2022 2233   MCV 93.2 06/03/2022 2233   MCH 30.2 06/03/2022 2233   MCHC 32.4 06/03/2022 2233   RDW 13.7 06/03/2022 2233      Latest Ref Rng & Units 06/03/2022   10:33 PM  BMP  Glucose 70 - 99 mg/dL 409   BUN 6 - 20 mg/dL <5   Creatinine 8.11 - 1.00 mg/dL 9.14   Sodium 782 - 956 mmol/L 142   Potassium 3.5 - 5.1 mmol/L 3.3   Chloride 98 - 111 mmol/L 116   CO2 22 - 32 mmol/L 21   Calcium 8.9 - 10.3 mg/dL 9.0    CTA chest abd pelvis: CTA Aortic Dissection  Result Date: 06/03/2022 EXAM: CTA Chest, Abdomen, Pelvis for Aortic Dissection DATE: 06/03/2022 3:36 AM ACCESSION: [id number] [facility] DICTATED: 06/03/2022 3:43 AM INTERPRETATION LOCATION: [facility] CLINICAL INDICATION:  56 years old Female with Tearing pain from her pelvic area to her chest, going through to her back with dizziness, could easily be constipation. COMPARISON: CT abdomen pelvis 08/15/2012 TECHNIQUE: A helical CT of the chest was obtained without IV contrast from the thoracic inlet through the hemidiaphragms. Images were reconstructed in the axial plane. Next, a spiral CTA of the chest, abdomen and pelvis was obtained with IV contrast from the thoracic inlet through the aortic bifurcation. Images were reconstructed in the axial plane. Multiplanar reformatted and MIP images are provided for further evaluation of the aorta. FINDINGS: AORTA: Normal caliber aorta. No thoracic aortic intramural hematoma. No aortic dissection. Celiac, SMA, and IMA are patent. Left renal artery is patent. Right main renal artery and accessory right lower pole renal artery are patent. CHEST: Normal heart size. Aortic valve calcifications. No pericardial effusion. No mediastinal lymphadenopathy. Clear central  airways. No consolidation. No pleural effusion. ABDOMEN and PELVIS: HEPATOBILIARY: No focal hepatic lesions. The gallbladder is present and otherwise unremarkable. No biliary dilatation. SPLEEN: Unremarkable. PANCREAS: Unremarkable. ADRENALS: Unremarkable. KIDNEYS/URETERS: Unremarkable. BLADDER: Decompressed with Foley in place. PELVIC/REPRODUCTIVE ORGANS: Retroverted uterus. GI TRACT: No dilated loop of small bowel. Prominent distention of the cecum/ascending colon measuring up to 8.8 cm (8:32). There is layering fluid within the proximal colon. Normal appendix (8:40). Scattered sigmoid diverticulosis without evidence of acute inflammation. PERITONEUM/RETROPERITONEUM AND MESENTERY: No free air or fluid. LYMPH NODES: No enlarged lymph nodes. BONES: Degenerative changes of the spine. SOFT TISSUES: Small to moderate fat-containing umbilical hernia without evidence of acute inflammatory change. There is no bowel within the hernia.   No  aortic dissection. Prominent distention of the cecum/ascending colon with layering fluid and gradual tapering of bowel caliber to normal at the level of the splenic flexure. Colonic pseudoobstruction could be considered in the appropriate clinical setting. Recommend clinical correlation ++++++++++++++++++++ The findings of this study were discussed via telephone with DR. Thomas by Dr. Tiana Loft on 06/03/2022 4:03 AM. -----------------------------------------------    Assessment and Plan: * Pseudoobstruction of colon No recent medication changes to explain. Distention of cecum and ascending colon to max diameter 8.8cm on CT at OSH. Pain controlled with morphine. NPO IVF Morphine PRN pain control Message sent to Dr. Therisa Doyne, GI to see tomorrow. Replace K  Rectal prolapse Sounds like she had rectal prolapse following the straining to have BM on 7/14, but she was able to resolve this manually.  No rectal prolapse since presentation to ED.  Acute urinary retention Sounds like patient has some degree of neurogenic bladder at baseline ever since the 2011 spinal cord injury.  But never had urinary retention or need for catheter until Wed. Foley in place Renal fxn looks good on BMP today May wish to consult urology in AM vs f/u outpatient  Neck pain Neck pain and headache since straining to have BM PTA to ED on Wed. CTA C/A/P at OSH ruled out aortic disection. Kidneys look okay on BMP this AM will get CTA neck to r/o carotid dissection.      Advance Care Planning:   Code Status: Full Code  Consults: Dr. Therisa Doyne  Family Communication: Family at bedside  Severity of Illness: The appropriate patient status for this patient is OBSERVATION. Observation status is judged to be reasonable and necessary in order to provide the required intensity of service to ensure the patient's safety. The patient's presenting symptoms, physical exam findings, and initial radiographic and laboratory  data in the context of their medical condition is felt to place them at decreased risk for further clinical deterioration. Furthermore, it is anticipated that the patient will be medically stable for discharge from the hospital within 2 midnights of admission.   Author: Etta Quill., DO 06/04/2022 12:31 AM  For on call review [email or web address].

## 2022-06-05 DIAGNOSIS — Z87898 Personal history of other specified conditions: Secondary | ICD-10-CM | POA: Diagnosis not present

## 2022-06-05 DIAGNOSIS — N8110 Cystocele, unspecified: Secondary | ICD-10-CM | POA: Diagnosis not present

## 2022-06-05 DIAGNOSIS — K5981 Ogilvie syndrome: Secondary | ICD-10-CM | POA: Diagnosis not present

## 2022-06-05 DIAGNOSIS — K5900 Constipation, unspecified: Secondary | ICD-10-CM | POA: Diagnosis not present

## 2022-06-05 LAB — RENAL FUNCTION PANEL
Albumin: 3.4 g/dL — ABNORMAL LOW (ref 3.5–5.0)
Anion gap: 5 (ref 5–15)
BUN: 8 mg/dL (ref 6–20)
CO2: 27 mmol/L (ref 22–32)
Calcium: 9.3 mg/dL (ref 8.9–10.3)
Chloride: 110 mmol/L (ref 98–111)
Creatinine, Ser: 0.59 mg/dL (ref 0.44–1.00)
GFR, Estimated: >60 mL/min (ref >60)
Glucose, Bld: 88 mg/dL (ref 70–99)
Phosphorus: 4.7 mg/dL — ABNORMAL HIGH (ref 2.5–4.6)
Potassium: 3.7 mmol/L (ref 3.5–5.1)
Sodium: 142 mmol/L (ref 135–145)

## 2022-06-05 LAB — CBC
HCT: 40.7 % (ref 36.0–46.0)
Hemoglobin: 13.5 g/dL (ref 12.0–15.0)
MCH: 30.7 pg (ref 26.0–34.0)
MCHC: 33.2 g/dL (ref 30.0–36.0)
MCV: 92.5 fL (ref 80.0–100.0)
Platelets: 202 10*3/uL (ref 150–400)
RBC: 4.4 MIL/uL (ref 3.87–5.11)
RDW: 13.7 % (ref 11.5–15.5)
WBC: 4.9 10*3/uL (ref 4.0–10.5)
nRBC: 0 % (ref 0.0–0.2)

## 2022-06-05 LAB — TSH: TSH: 1.928 u[IU]/mL (ref 0.350–4.500)

## 2022-06-05 LAB — MAGNESIUM: Magnesium: 2.3 mg/dL (ref 1.7–2.4)

## 2022-06-05 MED ORDER — POLYETHYLENE GLYCOL 3350 17 G PO PACK
17.0000 g | PACK | Freq: Three times a day (TID) | ORAL | Status: DC
Start: 2022-06-05 — End: 2022-06-09
  Administered 2022-06-05 – 2022-06-09 (×9): 17 g via ORAL
  Filled 2022-06-05 (×10): qty 1

## 2022-06-05 MED ORDER — MELATONIN 5 MG PO TABS
5.0000 mg | ORAL_TABLET | Freq: Every day | ORAL | Status: DC
Start: 2022-06-05 — End: 2022-06-09
  Administered 2022-06-05 – 2022-06-08 (×4): 5 mg via ORAL
  Filled 2022-06-05 (×4): qty 1

## 2022-06-05 MED ORDER — CHLORHEXIDINE GLUCONATE CLOTH 2 % EX PADS
6.0000 | MEDICATED_PAD | Freq: Every day | CUTANEOUS | Status: DC
  Administered 2022-06-05 – 2022-06-08 (×4): 6 via TOPICAL

## 2022-06-05 MED ORDER — POTASSIUM CHLORIDE CRYS ER 20 MEQ PO TBCR
40.0000 meq | EXTENDED_RELEASE_TABLET | Freq: Once | ORAL | Status: AC
  Administered 2022-06-05: 40 meq via ORAL
  Filled 2022-06-05: qty 2

## 2022-06-05 MED ORDER — PANTOPRAZOLE SODIUM 40 MG IV SOLR
40.0000 mg | INTRAVENOUS | Status: DC
  Administered 2022-06-05 – 2022-06-09 (×5): 40 mg via INTRAVENOUS
  Filled 2022-06-05 (×5): qty 10

## 2022-06-05 MED ORDER — FLEET ENEMA 7-19 GM/118ML RE ENEM
1.0000 | ENEMA | Freq: Once | RECTAL | Status: AC
  Administered 2022-06-05: 1 via RECTAL
  Filled 2022-06-05: qty 1
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
## 2022-06-05 NOTE — Progress Notes (Signed)
[facility] Progress Note  Karen Krause 56 y.o. 1966/09/23  CC: Abdominal pain, constipation   Subjective: Patient seen and examined at bedside.  Continues to have difficulty with bowel movements.  Complaining of some nausea particularly after meals.  Also complaining of generalized abdominal discomfort particularly after meals.  ROS : afebrile, negative for chest pain   Objective: Vital signs in last 24 hours: Vitals:   06/04/22 2224 06/05/22 0522  BP: (!) 125/96 117/72  Pulse: 68 71  Resp: 18   Temp: 98.1 F (36.7 C) 97.8 F (36.6 C)  SpO2: 100% 100%    Physical Exam:  General:  Alert, cooperative, no distress, appears stated age  Head:  Normocephalic, without obvious abnormality, atraumatic  Eyes:  , EOM's intact,   Lungs:   Clear to auscultation bilaterally, respirations unlabored  Heart:  Regular rate and rhythm, S1, S2 normal  Abdomen:   Abdomen is soft, nondistended, bowel sounds present, no peritoneal signs  Extremities: Extremities normal, atraumatic, no  edema  Pulses: 2+ and symmetric    Lab Results: Recent Labs    06/03/22 2233 06/05/22 0517  NA 142 142  K 3.3* 3.7  CL 116* 110  CO2 21* 27  GLUCOSE 110* 88  BUN <5* 8  CREATININE 0.45 0.59  CALCIUM 9.0 9.3  MG  --  2.3  PHOS  --  4.7*   Recent Labs    06/03/22 2233 06/05/22 0517  AST 22  --   ALT 26  --   ALKPHOS 80  --   BILITOT 0.8  --   PROT 6.8  --   ALBUMIN 3.6 3.4*   Recent Labs    06/03/22 2233 06/05/22 0517  WBC 6.0 4.9  HGB 13.8 13.5  HCT 42.6 40.7  MCV 93.2 92.5  PLT 212 202   No results for input(s): "LABPROT", "INR" in the last 72 hours.    Assessment/Plan: -Abnormal CT scan concerning for colonic pseudoobstruction.  CT scan at outside hospital on June 03, 2022 showed dilation of the cecum and ascending colon up to 8.8 cm.  Abdominal x-ray yesterday showed normal bowel gas pattern. -Constipation.  Could be related to bladder prolapse.  Currently has  Foley catheter. -Epigastric and generalized abdominal discomfort.  Recommendations ------------------------- -Increase MiraLAX to 3 times a day -One-time fleet enema -Start Protonix IV 40 mg once a day -GI will follow   Kathi Der MD, FACP 06/05/2022, 8:50 AM  Contact #  [phone]

## 2022-06-06 DIAGNOSIS — K5900 Constipation, unspecified: Secondary | ICD-10-CM | POA: Diagnosis not present

## 2022-06-06 DIAGNOSIS — N8110 Cystocele, unspecified: Secondary | ICD-10-CM | POA: Diagnosis not present

## 2022-06-06 DIAGNOSIS — K5981 Ogilvie syndrome: Secondary | ICD-10-CM | POA: Diagnosis not present

## 2022-06-06 DIAGNOSIS — Z87898 Personal history of other specified conditions: Secondary | ICD-10-CM | POA: Diagnosis not present

## 2022-06-06 MED ORDER — LORAZEPAM 2 MG/ML IJ SOLN
0.5000 mg | Freq: Four times a day (QID) | INTRAMUSCULAR | Status: DC | PRN
Start: 2022-06-06 — End: 2022-06-09

## 2022-06-06 NOTE — Progress Notes (Signed)
[facility] Progress Note  Karen Krause 56 y.o. [birth year]  CC: Abdominal pain, constipation   Subjective: Patient seen and examined at bedside.  She had a good night rest but started having significant pelvic pain/rectal pain.  No bowel movement despite of Fleet enema and increasing MiraLAX.  Had some nausea but denies any vomiting.  ROS : afebrile, negative for chest pain   Objective: Vital signs in last 24 hours: Vitals:   06/05/22 2206 06/06/22 0616  BP: 132/72 106/69  Pulse: 64 66  Resp: 18 17  Temp: 97.8 F (36.6 C) (!) 97.5 F (36.4 C)  SpO2: 100% 100%    Physical Exam:  General:  Alert, cooperative, no distress, appears stated age  Head:  Normocephalic, without obvious abnormality, atraumatic  Eyes:  , EOM's intact,   Lungs:   Clear to auscultation bilaterally, respirations unlabored  Heart:  Regular rate and rhythm, S1, S2 normal  Abdomen:   Abdomen is soft, suprapubic and bilateral lower quadrant discomfort on palpation, bowel sounds present, no peritoneal signs  Extremities: Extremities normal, atraumatic, no  edema  Pulses: 2+ and symmetric    Lab Results: Recent Labs    06/03/22 2233 06/05/22 0517  NA 142 142  K 3.3* 3.7  CL 116* 110  CO2 21* 27  GLUCOSE 110* 88  BUN <5* 8  CREATININE 0.45 0.59  CALCIUM 9.0 9.3  MG  --  2.3  PHOS  --  4.7*   Recent Labs    06/03/22 2233 06/05/22 0517  AST 22  --   ALT 26  --   ALKPHOS 80  --   BILITOT 0.8  --   PROT 6.8  --   ALBUMIN 3.6 3.4*   Recent Labs    06/03/22 2233 06/05/22 0517  WBC 6.0 4.9  HGB 13.8 13.5  HCT 42.6 40.7  MCV 93.2 92.5  PLT 212 202   No results for input(s): "LABPROT", "INR" in the last 72 hours.    Assessment/Plan: -Abnormal CT scan concerning for colonic pseudoobstruction.  CT scan at outside hospital on June 03, 2022 showed dilation of the cecum and ascending colon up to 8.8 cm.  Abdominal x-ray yesterday showed normal bowel gas pattern. -Constipation.   Could be related to bladder prolapse.  Currently has Foley catheter. -Epigastric and generalized abdominal discomfort.  Recommendations ------------------------- - MRI pelvis for worsening pelvic/rectal pain.   -She may have fecal impaction in the rectum.  Given amount of pain, I do not think she will be able to tolerate rectal exam or manual disimpaction.  Plan for flexible sigmoidoscopy tomorrow -Continue MiraLAX 3 times a day for now.   Risks (bleeding, infection, bowel perforation that could require surgery, sedation-related changes in cardiopulmonary systems), benefits (identification and possible treatment of source of symptoms, exclusion of certain causes of symptoms), and alternatives (watchful waiting, radiographic imaging studies, empiric medical treatment)  were explained to patient/family in detail and patient wishes to proceed.    Kathi Der MD, FACP 06/06/2022, 11:49 AM  Contact #  [phone]
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
## 2022-06-07 ENCOUNTER — Inpatient Hospital Stay (HOSPITAL_COMMUNITY): Payer: No Typology Code available for payment source

## 2022-06-07 ENCOUNTER — Encounter (HOSPITAL_COMMUNITY): Payer: Self-pay | Admitting: Anesthesiology

## 2022-06-07 DIAGNOSIS — K5900 Constipation, unspecified: Secondary | ICD-10-CM | POA: Diagnosis not present

## 2022-06-07 DIAGNOSIS — Z87898 Personal history of other specified conditions: Secondary | ICD-10-CM | POA: Diagnosis not present

## 2022-06-07 DIAGNOSIS — N8110 Cystocele, unspecified: Secondary | ICD-10-CM | POA: Diagnosis not present

## 2022-06-07 DIAGNOSIS — K5981 Ogilvie syndrome: Secondary | ICD-10-CM | POA: Diagnosis not present

## 2022-06-07 LAB — CBC
HCT: 41.7 % (ref 36.0–46.0)
Hemoglobin: 13.9 g/dL (ref 12.0–15.0)
MCH: 30.5 pg (ref 26.0–34.0)
MCHC: 33.3 g/dL (ref 30.0–36.0)
MCV: 91.4 fL (ref 80.0–100.0)
Platelets: 212 10*3/uL (ref 150–400)
RBC: 4.56 MIL/uL (ref 3.87–5.11)
RDW: 13.4 % (ref 11.5–15.5)
WBC: 5.4 10*3/uL (ref 4.0–10.5)
nRBC: 0 % (ref 0.0–0.2)

## 2022-06-07 LAB — RENAL FUNCTION PANEL
Albumin: 3.4 g/dL — ABNORMAL LOW (ref 3.5–5.0)
Anion gap: 6 (ref 5–15)
BUN: 7 mg/dL (ref 6–20)
CO2: 26 mmol/L (ref 22–32)
Calcium: 9.6 mg/dL (ref 8.9–10.3)
Chloride: 109 mmol/L (ref 98–111)
Creatinine, Ser: 0.63 mg/dL (ref 0.44–1.00)
GFR, Estimated: >60 mL/min (ref >60)
Glucose, Bld: 105 mg/dL — ABNORMAL HIGH (ref 70–99)
Phosphorus: 5.9 mg/dL — ABNORMAL HIGH (ref 2.5–4.6)
Potassium: 4.4 mmol/L (ref 3.5–5.1)
Sodium: 141 mmol/L (ref 135–145)

## 2022-06-07 LAB — MAGNESIUM: Magnesium: 2.2 mg/dL (ref 1.7–2.4)

## 2022-06-07 MED ORDER — GADOBUTROL 1 MMOL/ML IV SOLN
7.5000 mL | Freq: Once | INTRAVENOUS | Status: AC | PRN
  Administered 2022-06-07: 7.5 mL via INTRAVENOUS

## 2022-06-07 MED ORDER — DEXTROSE IN LACTATED RINGERS 5 % IV SOLN: INTRAVENOUS | Status: AC

## 2022-06-07 NOTE — Progress Notes (Signed)
[facility] Progress Note  Karen Krause 56 y.o. 1966/06/21   Subjective: Patient seen and examined lying in bed.  She notes she will have urgency associated with lower abdominal pain and fullness.  When she goes to the bathroom to have a bowel movement she is unable to pass any stool and her pain will turn severe.  She is still waiting to go down for MRI.  ROS : Review of Systems  Gastrointestinal:  Positive for abdominal pain and constipation. Negative for blood in stool, diarrhea, heartburn, melena, nausea and vomiting.  Genitourinary:  Negative for dysuria and urgency.    Objective: Vital signs in last 24 hours: Vitals:   06/07/22 1355 06/07/22 1359  BP:  99/86  Pulse:  66  Resp:  18  Temp:  98.3 F (36.8 C)  SpO2: 100% 100%    Physical Exam:  General:  Alert, cooperative, no distress, appears stated age  Head:  Normocephalic, without obvious abnormality, atraumatic  Eyes:  Anicteric sclera, EOM's intact  Lungs:   Clear to auscultation bilaterally, respirations unlabored  Heart:  Regular rate and rhythm, S1, S2 normal  Abdomen:   Soft, right lower abdominal tenderness, bowel sounds active all four quadrants,  no masses,   Extremities: Extremities normal, atraumatic, no  edema  Pulses: 2+ and symmetric    Lab Results: Recent Labs    06/05/22 0517 06/07/22 0432  NA 142 141  K 3.7 4.4  CL 110 109  CO2 27 26  GLUCOSE 88 105*  BUN 8 7  CREATININE 0.59 0.63  CALCIUM 9.3 9.6  MG 2.3 2.2  PHOS 4.7* 5.9*   Recent Labs    06/05/22 0517 06/07/22 0432  ALBUMIN 3.4* 3.4*   Recent Labs    06/05/22 0517 06/07/22 0432  WBC 4.9 5.4  HGB 13.5 13.9  HCT 40.7 41.7  MCV 92.5 91.4  PLT 202 212   No results for input(s): "LABPROT", "INR" in the last 72 hours.    Assessment Colonic pseudoobstruction Abdominal pain Constipation  CT scan 06/03/2022 Cecum and ascending colon dilation up to 8.8 cm  Abdomen 2 view 7/22 Normal bowel gas  pattern  Possible constipation due to bladder prolapse, patient does note lower abdominal/pelvic fullness.  Likely abdominal pain and discomfort due to constipation. Possible fecal impaction  Plan: Awaiting MRI pelvis for evaluation of pelvic/rectal pain. Continue MiraLAX 3 times daily Continue pain control as needed. [facility] will follow  Emmit Alexanders PA-C 06/07/2022, 3:02 PM  Contact #  [phone]

## 2022-06-07 NOTE — Progress Notes (Addendum)
   06/07/22 1355  Oxygen Therapy  SpO2 100 %  Mobility  Activity Ambulated with assistance in hallway  Level of Assistance Contact guard assist, steadying assist  Assistive Device Front wheel walker  Distance Ambulated (ft) 20 ft  Activity Response Tolerated well  Transport method Ambulatory  $Mobility charge 1 Mobility    Pt c/o of pain prior to ambulation. During ambulation took 6 rest breaks and experienced SOB secondary to pain. Checked O2 levels which were at 100%. Pain was a 7/10 during and after ambulation. Returned to bed with all necessities in reach.    Marilynne Halsted  Mobility Specialist
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
## 2022-06-08 ENCOUNTER — Encounter (HOSPITAL_COMMUNITY)
Admission: AD | Disposition: A | Discharge status: Home / Self Care | Payer: Self-pay | Source: Other Acute Inpatient Hospital | Attending: Student

## 2022-06-08 DIAGNOSIS — K5900 Constipation, unspecified: Secondary | ICD-10-CM | POA: Diagnosis not present

## 2022-06-08 DIAGNOSIS — Z87898 Personal history of other specified conditions: Secondary | ICD-10-CM | POA: Diagnosis not present

## 2022-06-08 DIAGNOSIS — K5981 Ogilvie syndrome: Secondary | ICD-10-CM | POA: Diagnosis not present

## 2022-06-08 DIAGNOSIS — N8110 Cystocele, unspecified: Secondary | ICD-10-CM | POA: Diagnosis not present

## 2022-06-08 SURGERY — CANCELLED PROCEDURE

## 2022-06-08 MED ORDER — OXYBUTYNIN CHLORIDE 5 MG PO TABS
5.0000 mg | ORAL_TABLET | Freq: Three times a day (TID) | ORAL | Status: DC
  Administered 2022-06-08 – 2022-06-09 (×4): 5 mg via ORAL
  Filled 2022-06-08 (×4): qty 1

## 2022-06-08 NOTE — Consult Note (Addendum)
   [facility] Inpatient Consult   06/08/2022  Vallory Oetken 1966-02-09 [id number]   [facility] [[facility]]  Accountable Care Organization [ACO] Patient: [facility] Employee Plan  Referral: Insurance Plan  *Remote review and call- patient at [facility]  Primary Care Provider:  Alinda Deem, MD, [facility], [place] is listed as a [facility] provider per roster.  Note a procedure today. Call placed to patient and HIPPA identified. Patient states she was hopeful for possible home today. Explained the nature of call for post hospital follow up and she was appreciative of the call.   Plan: Continue to follow progress.  Patient will be followed by [facility] RN Care Coordinator.    For additional questions or referrals please contact:   Charlesetta Shanks, RN BSN CCM [facility]  [phone] business mobile phone Toll free office [phone]  Fax number: [phone] [email or web address]

## 2022-06-08 NOTE — Progress Notes (Signed)
[facility] Progress Note  Karen Krause 56 y.o. [birth year]   Subjective: Lower abdominal pain with walking and urge to defecate. Denies bulging from rectum and denies having a BM overnight. Feels pressure in area of catheter.  Objective: Vital signs: Vitals:   06/07/22 2118 06/08/22 0535  BP: 105/70 (!) 91/57  Pulse: 66 67  Resp: 16 18  Temp: 97.8 F (36.6 C) (!) 97.3 F (36.3 C)  SpO2: 100% 100%    Physical Exam: Gen: lethargic, no acute distress, well-nourished HEENT: anicteric sclera CV: RRR Chest: CTA B Abd: LLQ tenderness with guarding, minimal suprapubic tenderness, soft, nondistended, +BS Ext: no edema  Lab Results: Recent Labs    06/07/22 0432  NA 141  K 4.4  CL 109  CO2 26  GLUCOSE 105*  BUN 7  CREATININE 0.63  CALCIUM 9.6  MG 2.2  PHOS 5.9*   Recent Labs    06/07/22 0432  ALBUMIN 3.4*   Recent Labs    06/07/22 0432  WBC 5.4  HGB 13.9  HCT 41.7  MCV 91.4  PLT 212      Assessment/Plan: Abdominal pain with MRI NEG for fecal impaction; normal stool burden; no sign of pelvic floor prolapse. Reassurance given. She could have rectal prolapse occurring and advised that if a bulge happens and it does not resolve spontaneously or is small and cannot be manually reduced then may need a surgical evaluation. Advised to avoid narcotic pain meds if possible to avoid constipation. Defer continued use of urinary catheter to primary team. Outpt colonoscopy. Will advance diet. Will sign off. Call if questions. F/U with [facility] in 6-8 weeks.   Karen Krause 06/08/2022, 8:34 AM  Questions please call [phone] Patient ID: Karen Krause, female   DOB: 1966-08-10, 56 y.o.   MRN: [id number]
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
## 2022-06-09 ENCOUNTER — Other Ambulatory Visit (HOSPITAL_COMMUNITY): Payer: Self-pay

## 2022-06-09 ENCOUNTER — Other Ambulatory Visit: Payer: Self-pay

## 2022-06-09 DIAGNOSIS — K623 Rectal prolapse: Secondary | ICD-10-CM | POA: Diagnosis not present

## 2022-06-09 DIAGNOSIS — R338 Other retention of urine: Secondary | ICD-10-CM | POA: Diagnosis not present

## 2022-06-09 DIAGNOSIS — F411 Generalized anxiety disorder: Secondary | ICD-10-CM

## 2022-06-09 DIAGNOSIS — M542 Cervicalgia: Secondary | ICD-10-CM | POA: Diagnosis not present

## 2022-06-09 DIAGNOSIS — K5981 Ogilvie syndrome: Secondary | ICD-10-CM

## 2022-06-09 DIAGNOSIS — K5903 Drug induced constipation: Secondary | ICD-10-CM

## 2022-06-09 MED ORDER — POLYETHYLENE GLYCOL 3350 17 GM/SCOOP PO POWD
17.0000 g | Freq: Two times a day (BID) | ORAL | 1 refills | Status: AC | PRN
  Filled 2022-06-09: qty 238, 7d supply, fill #0

## 2022-06-09 MED ORDER — OXYBUTYNIN CHLORIDE 5 MG PO TABS
5.0000 mg | ORAL_TABLET | Freq: Three times a day (TID) | ORAL | 0 refills | Status: AC
  Filled 2022-06-09: qty 90, 30d supply, fill #0
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
## 2022-06-09 NOTE — Plan of Care (Signed)

## 2022-06-09 NOTE — TOC Progression Note (Signed)
Transition of Care [facility]) - Progression Note    Patient Details  Name: Karen Krause MRN: [id number] Date of Birth: [birth year]  Transition of Care [facility]) CM/SW Contact  Coralyn Helling, [place] Phone Number: 06/09/2022, 10:18 AM  Clinical Narrative:      Transition of Care (TOC) Screening Note   Patient Details  Name: Karen Krause Date of Birth: 03/11/1966   Transition of Care [facility]) CM/SW Contact:    Coralyn Helling, LCSW Phone Number: 06/09/2022, 10:19 AM    Transition of Care Department [facility]) has reviewed patient and no TOC needs have been identified at this time. We will continue to monitor patient advancement through interdisciplinary progression rounds. If new patient transition needs arise, please place a TOC consult.      Barriers to Discharge: No Barriers Identified  Expected Discharge Plan and Services           Expected Discharge Date: 06/09/22                                     Social Determinants of Health (SDOH) Interventions    Readmission Risk Interventions    06/08/2022    1:33 PM  Readmission Risk Prevention Plan  Post Dischage Appt Complete  Medication Screening Complete  Transportation Screening Complete

## 2022-06-21 ENCOUNTER — Other Ambulatory Visit (HOSPITAL_BASED_OUTPATIENT_CLINIC_OR_DEPARTMENT_OTHER): Payer: Self-pay

## 2022-06-21 ENCOUNTER — Other Ambulatory Visit (HOSPITAL_COMMUNITY): Payer: Self-pay

## 2022-06-21 MED ORDER — CIPROFLOXACIN HCL 250 MG PO TABS
ORAL_TABLET | ORAL | 0 refills | Status: AC
Start: 2022-06-21 — End: ?
  Filled 2022-06-21 (×2): qty 14, 7d supply, fill #0

## 2022-06-22 ENCOUNTER — Other Ambulatory Visit (HOSPITAL_COMMUNITY): Payer: Self-pay

## 2022-06-23 ENCOUNTER — Other Ambulatory Visit (HOSPITAL_COMMUNITY): Payer: Self-pay

## 2022-07-01 ENCOUNTER — Other Ambulatory Visit: Payer: Self-pay | Admitting: *Deleted

## 2022-07-01 NOTE — Patient Outreach (Signed)
  Care Coordination TOC Note Transition Care Management Follow-up Telephone Call Date of discharge and from where: [facility] How have you been since you were released from the hospital? Improving every day. Any questions or concerns? No  Items Reviewed: Did the pt receive and understand the discharge instructions provided? Yes  Medications obtained and verified? Yes  Other? No  Any new allergies since your discharge? No  Dietary orders reviewed? Yes Do you have support at home? Yes   Home Care and Equipment/Supplies: Were home health services ordered? yes If so, what is the name of the agency?   Has the agency set up a time to come to the patient's home? yes Were any new equipment or medical supplies ordered?  Yes:   What is the name of the medical supply agency?  Were you able to get the supplies/equipment? yes Do you have any questions related to the use of the equipment or supplies? No  Functional Questionnaire: (I = Independent and D = Dependent) ADLs: I  Bathing/Dressing- I  Meal Prep- I  Eating- I   Maintaining continence- I  Transferring/Ambulation- I  Managing Meds- I  Follow up appointments reviewed:  PCP Hospital f/u appt confirmed? Yes  Scheduled to see UROLOGIST NEXT WEEK. [facility] Hospital f/u appt confirmed? Yes   Are transportation arrangements needed? No  If their condition worsens, is the pt aware to call PCP or go to the Emergency Dept.? Yes Was the patient provided with contact information for the PCP's office or ED? No Was to pt encouraged to call back with questions or concerns? Yes  SDOH assessments and interventions completed:   No  Care Coordination Interventions Activated:  No   Care Coordination Interventions:   NONE NEEDED.     Encounter Outcome:  Pt. Refused    Cambridge Deleo C. Burgess Estelle, MSN, [facility] Gerontological Nurse Practitioner [facility] Care Management [phone]

## 2022-07-06 ENCOUNTER — Other Ambulatory Visit (HOSPITAL_COMMUNITY): Payer: Self-pay

## 2022-07-06 ENCOUNTER — Ambulatory Visit
Admission: RE | Admit: 2022-07-06 | Discharge: 2022-07-06 | Disposition: A | Discharge status: Home / Self Care | Payer: No Typology Code available for payment source | Source: Ambulatory Visit | Attending: Gastroenterology | Admitting: Gastroenterology

## 2022-07-06 ENCOUNTER — Other Ambulatory Visit: Payer: Self-pay | Admitting: Gastroenterology

## 2022-07-06 DIAGNOSIS — K5904 Chronic idiopathic constipation: Secondary | ICD-10-CM

## 2022-08-04 ENCOUNTER — Encounter (HOSPITAL_COMMUNITY): Payer: No Typology Code available for payment source

## 2022-08-04 ENCOUNTER — Ambulatory Visit: Payer: No Typology Code available for payment source | Admitting: Vascular Surgery

## 2022-08-05 ENCOUNTER — Other Ambulatory Visit: Payer: Self-pay | Admitting: *Deleted

## 2022-08-05 DIAGNOSIS — M79606 Pain in leg, unspecified: Secondary | ICD-10-CM

## 2022-08-11 ENCOUNTER — Encounter: Payer: Self-pay | Admitting: Vascular Surgery

## 2022-08-11 ENCOUNTER — Ambulatory Visit (HOSPITAL_COMMUNITY)
Admission: RE | Admit: 2022-08-11 | Discharge: 2022-08-11 | Disposition: A | Discharge status: Home / Self Care | Payer: No Typology Code available for payment source | Source: Ambulatory Visit | Attending: Vascular Surgery | Admitting: Vascular Surgery

## 2022-08-11 ENCOUNTER — Ambulatory Visit (INDEPENDENT_AMBULATORY_CARE_PROVIDER_SITE_OTHER): Payer: No Typology Code available for payment source | Admitting: Vascular Surgery

## 2022-08-11 VITALS — BP 105/70 | HR 80 | Temp 98.1°F | Resp 18 | Ht 64.0 in | Wt 166.5 lb

## 2022-08-11 DIAGNOSIS — M79606 Pain in leg, unspecified: Secondary | ICD-10-CM | POA: Insufficient documentation

## 2022-08-11 DIAGNOSIS — I872 Venous insufficiency (chronic) (peripheral): Secondary | ICD-10-CM | POA: Diagnosis not present

## 2022-08-11 NOTE — Progress Notes (Signed)
REASON FOR VISIT:   Follow-up of chronic venous insufficiency.  MEDICAL ISSUES:   CHRONIC VENOUS INSUFFICIENCY: The patient is doing well status post previous laser ablation of the left anterior accessory saphenous vein and greater than 20 stabs, and laser ablation of the right great saphenous vein and greater than 20 stabs.  Her venous duplex scan today shows no significant deep venous reflux.  Her right great saphenous vein has been ablated and she has minimal reflux in the anterior accessory saphenous vein on the right and the small saphenous vein.  We have discussed the importance of intermittent leg elevation and the proper positioning for this.  I encouraged her to continue to wear her compression stockings.  She is wearing a knee-high stocking with a gradient of 20 to 30 mmHg.  We discussed importance of exercise specifically walking and water aerobics.  I encouraged her to avoid prolonged sitting and standing.  She would like to have the left leg checked also so we will bring her back for formal venous reflux testing on the left and an office visit in the next few weeks.  HPI:   Karen Krause is a pleasant 56 y.o. female who I originally saw on 06/14/2019 with painful varicose veins bilaterally.  She failed conservative treatment and on 03/20/2020 underwent laser ablation of the left anterior accessory saphenous vein with greater than 20 stab phlebectomies.  On 04/10/2020 she underwent laser ablation of the right great saphenous vein and greater than 20 stabs.  I last saw her on 04/17/2020.  This showed successful closure of her great saphenous vein on the right.  I was to see her back as needed.  She comes in for routine follow-up visit.  Since I saw her last she has had multiple medical issues which have been addressed and she is now doing very well.  I believe that all started with a neurogenic bladder.  She was having urinary retention up to 1.2 L in the doctor's were amazed that the  bladder had not ruptured.  Apparently this was compressing her colon and she developed an Ogilvie syndrome.  This is all been addressed and since that time she has done well.  Of note, she is also being treated for Lyme's disease.  She denies significant aching pain or heaviness in her legs to suggest venous hypertension.  She has had no significant varicose veins that have been problematic.   Past Medical History:  Diagnosis Date   Allergy    Chronic bronchitis (HCC)    Dizziness    Frequent episodes of sinusitis    Headache    Hypothyroidism    Leg pain    Bilateral   Muscle weakness    Seizure disorder (HCC)    Stiff neck    Tingling sensation    Umbilical hernia    Varicose vein of leg    Varicosities of leg     Family History  Problem Relation Age of Onset   Dementia Mother    Cancer Father    Cancer Maternal Grandmother    Dementia Paternal Grandmother     SOCIAL HISTORY: Social History   Tobacco Use   Smoking status: Never   Smokeless tobacco: Never  Substance Use Topics   Alcohol use: Never    Allergies  Allergen Reactions   Codeine Nausea And Vomiting and Other (See Comments)    Severe headaches, hallucinations, fever Hallucinations   Diphenhydramine Hcl Other (See Comments)    States she has  a very hard time waking up with even a small amount of benadryl     Current Outpatient Medications  Medication Sig Dispense Refill   ciprofloxacin (CIPRO) 250 MG tablet Take 1 tablet by mouth twice daily. (Patient not taking: Reported on 08/11/2022) 14 tablet 0   liothyronine (CYTOMEL) 5 MCG tablet Take 1 tablet by mouth every morning (Patient not taking: Reported on 08/11/2022) 30 tablet 3   OVER THE COUNTER MEDICATION Place 1 tablet under the tongue daily. Medication: Estrogen po SL (Patient not taking: Reported on 08/11/2022)     polyethylene glycol powder (MIRALAX) 17 GM/SCOOP powder Take 17 g by mouth 2 (two) times daily as needed for mild constipation. (Patient  not taking: Reported on 08/11/2022) 255 g 1   progesterone (PROMETRIUM) 200 MG capsule Take 1 capsule by mouth at bedtime (Patient not taking: Reported on 08/11/2022) 30 capsule 3   No current facility-administered medications for this visit.    REVIEW OF SYSTEMS:  [X]  denotes positive finding, [ ]  denotes negative finding Cardiac  Comments:  Chest pain or chest pressure:    Shortness of breath upon exertion:    Short of breath when lying flat:    Irregular heart rhythm:        Vascular    Pain in calf, thigh, or hip brought on by ambulation:    Pain in feet at night that wakes you up from your sleep:     Blood clot in your veins:    Leg swelling:  x       Pulmonary    Oxygen at home:    Productive cough:     Wheezing:         Neurologic    Sudden weakness in arms or legs:     Sudden numbness in arms or legs:     Sudden onset of difficulty speaking or slurred speech:    Temporary loss of vision in one eye:     Problems with dizziness:         Gastrointestinal    Blood in stool:     Vomited blood:         Genitourinary    Burning when urinating:     Blood in urine:        Psychiatric    Major depression:         Hematologic    Bleeding problems:    Problems with blood clotting too easily:        Skin    Rashes or ulcers:        Constitutional    Fever or chills:     PHYSICAL EXAM:   Vitals:   08/11/22 1548  BP: 105/70  Pulse: 80  Resp: 18  Temp: 98.1 F (36.7 C)  TempSrc: Temporal  SpO2: 99%  Weight: 166 lb 8 oz (75.5 kg)  Height: 5\' 4"  (1.626 m)   GENERAL: The patient is a well-nourished female, in no acute distress. The vital signs are documented above. CARDIAC: There is a regular rate and rhythm.  VASCULAR: I do not detect carotid bruits. She has palpable pedal pulses bilaterally. She has no enlarged varicose veins just some spider veins bilaterally. PULMONARY: There is good air exchange bilaterally without wheezing or rales. ABDOMEN: Soft and  non-tender with normal pitched bowel sounds.  MUSCULOSKELETAL: There are no major deformities or cyanosis. NEUROLOGIC: No focal weakness or paresthesias are detected. SKIN: There are no ulcers or rashes noted. PSYCHIATRIC: The patient has a normal  affect.  DATA:    VENOUS DUPLEX: I have independently interpreted her venous duplex scan today.  This was of the right lower extremity only.  There is no evidence of DVT.  There is no deep venous reflux.  The right great saphenous vein has been previously ablated.  There is a short segment of reflux in the proximal right anterior accessory saphenous vein however the vein is not dilated.  There is a short segment of reflux in the small saphenous vein but the vein is not dilated.     [name] [facility] [phone]

## 2022-08-13 ENCOUNTER — Other Ambulatory Visit: Payer: Self-pay

## 2022-08-13 DIAGNOSIS — I872 Venous insufficiency (chronic) (peripheral): Secondary | ICD-10-CM

## 2022-09-15 ENCOUNTER — Ambulatory Visit (HOSPITAL_COMMUNITY)
Admission: RE | Admit: 2022-09-15 | Discharge: 2022-09-15 | Disposition: A | Discharge status: Home / Self Care | Payer: No Typology Code available for payment source | Source: Ambulatory Visit | Attending: Vascular Surgery | Admitting: Vascular Surgery

## 2022-09-15 ENCOUNTER — Encounter: Payer: Self-pay | Admitting: Vascular Surgery

## 2022-09-15 ENCOUNTER — Ambulatory Visit (INDEPENDENT_AMBULATORY_CARE_PROVIDER_SITE_OTHER): Payer: No Typology Code available for payment source | Admitting: Vascular Surgery

## 2022-09-15 VITALS — BP 120/70 | HR 67 | Temp 97.9°F | Resp 16 | Ht 64.0 in | Wt 169.4 lb

## 2022-09-15 DIAGNOSIS — I872 Venous insufficiency (chronic) (peripheral): Secondary | ICD-10-CM | POA: Diagnosis not present

## 2022-09-15 DIAGNOSIS — I8393 Asymptomatic varicose veins of bilateral lower extremities: Secondary | ICD-10-CM

## 2022-09-15 NOTE — Progress Notes (Unsigned)
REASON FOR VISIT:   Follow-up of chronic venous insufficiency  MEDICAL ISSUES:   CHRONIC VENOUS INSUFFICIENCY: The patient has stable symptoms from her chronic venous insufficiency on the left leg.  She has deep venous reflux with minimal superficial venous reflux.  We have again discussed the importance of leg elevation and the proper positioning for this.  We will have her try a thigh-high compression stocking to see if this helps.  I encouraged her to remain active.  I have encouraged her to avoid prolonged sitting and standing.  She will call if her symptoms progress.  Currently she is not a candidate for laser ablation of the left great saphenous vein as there is only a short segment of reflux in the vein is small (diameters ranging from 2.4-3.7 mm).  HPI:   Karen Krause is a pleasant 56 y.o. female who has a history of chronic venous insufficiency.  She has previously had laser ablation of the left anterior accessory saphenous vein with greater than 20 stabs.  In addition she has had laser ablation of the right great saphenous vein with greater than 20 stabs.  I last saw her on 08/11/2022.  She was set up for a follow-up visit.  Since I saw her last she occasionally gets some aching pain and heaviness in the left leg which is aggravated by standing and sitting and relieved with elevation.  She has been wearing her compression stockings which help.  These are knee-high stockings.  She had minimal symptoms on the right side.  She occasionally gets some mild swelling after prolonged standing.  Past Medical History:  Diagnosis Date   Allergy    Chronic bronchitis (HCC)    Dizziness    Frequent episodes of sinusitis    Headache    Hypothyroidism    Leg pain    Bilateral   Muscle weakness    Seizure disorder (HCC)    Stiff neck    Tingling sensation    Umbilical hernia    Varicose vein of leg    Varicosities of leg     Family History  Problem Relation Age of Onset    Dementia Mother    Cancer Father    Cancer Maternal Grandmother    Dementia Paternal Grandmother     SOCIAL HISTORY: Social History   Tobacco Use   Smoking status: Never   Smokeless tobacco: Never  Substance Use Topics   Alcohol use: Never    Allergies  Allergen Reactions   Codeine Nausea And Vomiting and Other (See Comments)    Severe headaches, hallucinations, fever Hallucinations   Diphenhydramine Hcl Other (See Comments)    States she has a very hard time waking up with even a small amount of benadryl     Current Outpatient Medications  Medication Sig Dispense Refill   ciprofloxacin (CIPRO) 250 MG tablet Take 1 tablet by mouth twice daily. (Patient not taking: Reported on 08/11/2022) 14 tablet 0   liothyronine (CYTOMEL) 5 MCG tablet Take 1 tablet by mouth every morning (Patient not taking: Reported on 08/11/2022) 30 tablet 3   OVER THE COUNTER MEDICATION Place 1 tablet under the tongue daily. Medication: Estrogen po SL (Patient not taking: Reported on 08/11/2022)     polyethylene glycol powder (MIRALAX) 17 GM/SCOOP powder Take 17 g by mouth 2 (two) times daily as needed for mild constipation. (Patient not taking: Reported on 08/11/2022) 255 g 1   progesterone (PROMETRIUM) 200 MG capsule Take 1 capsule by mouth at  bedtime (Patient not taking: Reported on 08/11/2022) 30 capsule 3   No current facility-administered medications for this visit.    REVIEW OF SYSTEMS:  [X]  denotes positive finding, [ ]  denotes negative finding Cardiac  Comments:  Chest pain or chest pressure:    Shortness of breath upon exertion:    Short of breath when lying flat:    Irregular heart rhythm:        Vascular    Pain in calf, thigh, or hip brought on by ambulation:    Pain in feet at night that wakes you up from your sleep:     Blood clot in your veins:    Leg swelling:         Pulmonary    Oxygen at home:    Productive cough:     Wheezing:         Neurologic    Sudden weakness in arms  or legs:     Sudden numbness in arms or legs:     Sudden onset of difficulty speaking or slurred speech:    Temporary loss of vision in one eye:     Problems with dizziness:         Gastrointestinal    Blood in stool:     Vomited blood:         Genitourinary    Burning when urinating:     Blood in urine:        Psychiatric    Major depression:         Hematologic    Bleeding problems:    Problems with blood clotting too easily:        Skin    Rashes or ulcers:        Constitutional    Fever or chills:     PHYSICAL EXAM:   Vitals:   09/15/22 1403  BP: 120/70  Pulse: 67  Resp: 16  Temp: 97.9 F (36.6 C)  TempSrc: Temporal  SpO2: 99%  Weight: 169 lb 6.4 oz (76.8 kg)  Height: 5\' 4"  (1.626 m)    GENERAL: The patient is a well-nourished female, in no acute distress. The vital signs are documented above. CARDIAC: There is a regular rate and rhythm.  VASCULAR: She has palpable pedal pulses bilaterally. Currently she has no significant lower extremity swelling. She has some small varicose veins along the medial aspect of her left thigh. PULMONARY: There is good air exchange bilaterally without wheezing or rales. ABDOMEN: Soft and non-tender with normal pitched bowel sounds.  MUSCULOSKELETAL: There are no major deformities or cyanosis. NEUROLOGIC: No focal weakness or paresthesias are detected. SKIN: There are no ulcers or rashes noted. PSYCHIATRIC: The patient has a normal affect.  DATA:    VENOUS DUPLEX: I have independently interpreted the venous duplex scan today.  This was of the left lower extremity only.  There was no evidence of DVT.  There was deep venous reflux in the common femoral vein and popliteal vein.  There was superficial venous reflux in the left great saphenous vein from the saphenofemoral junction to the mid thigh.  The vein was small however with diameters ranging from 2.4-3.7 mm.  There was a short segment of reflux in the anterior sensory  saphenous vein which then was occluded from previous ablation.  The results of the study are summarized on the diagram below.     [name] [facility] [phone]

## 2023-02-23 DIAGNOSIS — N959 Unspecified menopausal and perimenopausal disorder: Secondary | ICD-10-CM | POA: Diagnosis not present

## 2023-02-23 DIAGNOSIS — E559 Vitamin D deficiency, unspecified: Secondary | ICD-10-CM | POA: Diagnosis not present

## 2023-02-23 DIAGNOSIS — R4189 Other symptoms and signs involving cognitive functions and awareness: Secondary | ICD-10-CM | POA: Diagnosis not present

## 2023-02-23 DIAGNOSIS — N390 Urinary tract infection, site not specified: Secondary | ICD-10-CM | POA: Diagnosis not present

## 2023-02-23 DIAGNOSIS — F0781 Postconcussional syndrome: Secondary | ICD-10-CM | POA: Diagnosis not present

## 2023-02-23 DIAGNOSIS — B379 Candidiasis, unspecified: Secondary | ICD-10-CM | POA: Diagnosis not present

## 2023-02-23 DIAGNOSIS — E279 Disorder of adrenal gland, unspecified: Secondary | ICD-10-CM | POA: Diagnosis not present

## 2023-02-23 DIAGNOSIS — J157 Pneumonia due to Mycoplasma pneumoniae: Secondary | ICD-10-CM | POA: Diagnosis not present

## 2023-02-23 DIAGNOSIS — F4310 Post-traumatic stress disorder, unspecified: Secondary | ICD-10-CM | POA: Diagnosis not present

## 2023-02-23 DIAGNOSIS — E079 Disorder of thyroid, unspecified: Secondary | ICD-10-CM | POA: Diagnosis not present

## 2023-03-18 ENCOUNTER — Other Ambulatory Visit (HOSPITAL_BASED_OUTPATIENT_CLINIC_OR_DEPARTMENT_OTHER): Payer: Self-pay

## 2023-03-25 ENCOUNTER — Other Ambulatory Visit (HOSPITAL_BASED_OUTPATIENT_CLINIC_OR_DEPARTMENT_OTHER): Payer: Self-pay

## 2023-05-27 DIAGNOSIS — B379 Candidiasis, unspecified: Secondary | ICD-10-CM | POA: Diagnosis not present

## 2023-05-27 DIAGNOSIS — N390 Urinary tract infection, site not specified: Secondary | ICD-10-CM | POA: Diagnosis not present

## 2023-05-27 DIAGNOSIS — E039 Hypothyroidism, unspecified: Secondary | ICD-10-CM | POA: Diagnosis not present

## 2023-05-27 DIAGNOSIS — E559 Vitamin D deficiency, unspecified: Secondary | ICD-10-CM | POA: Diagnosis not present

## 2023-05-27 DIAGNOSIS — J157 Pneumonia due to Mycoplasma pneumoniae: Secondary | ICD-10-CM | POA: Diagnosis not present

## 2023-05-27 DIAGNOSIS — E279 Disorder of adrenal gland, unspecified: Secondary | ICD-10-CM | POA: Diagnosis not present

## 2023-05-27 DIAGNOSIS — N959 Unspecified menopausal and perimenopausal disorder: Secondary | ICD-10-CM | POA: Diagnosis not present

## 2023-05-27 DIAGNOSIS — F0781 Postconcussional syndrome: Secondary | ICD-10-CM | POA: Diagnosis not present

## 2023-05-27 DIAGNOSIS — R5382 Chronic fatigue, unspecified: Secondary | ICD-10-CM | POA: Diagnosis not present

## 2023-05-27 DIAGNOSIS — F4310 Post-traumatic stress disorder, unspecified: Secondary | ICD-10-CM | POA: Diagnosis not present

## 2023-06-03 DIAGNOSIS — F430 Acute stress reaction: Secondary | ICD-10-CM | POA: Diagnosis not present

## 2023-06-09 DIAGNOSIS — F430 Acute stress reaction: Secondary | ICD-10-CM | POA: Diagnosis not present

## 2023-06-22 DIAGNOSIS — F430 Acute stress reaction: Secondary | ICD-10-CM | POA: Diagnosis not present

## 2023-06-29 DIAGNOSIS — F430 Acute stress reaction: Secondary | ICD-10-CM | POA: Diagnosis not present

## 2023-07-05 DIAGNOSIS — F430 Acute stress reaction: Secondary | ICD-10-CM | POA: Diagnosis not present

## 2023-07-13 DIAGNOSIS — F430 Acute stress reaction: Secondary | ICD-10-CM | POA: Diagnosis not present

## 2023-07-20 DIAGNOSIS — F430 Acute stress reaction: Secondary | ICD-10-CM | POA: Diagnosis not present

## 2023-07-27 DIAGNOSIS — F430 Acute stress reaction: Secondary | ICD-10-CM | POA: Diagnosis not present

## 2023-08-09 DIAGNOSIS — F430 Acute stress reaction: Secondary | ICD-10-CM | POA: Diagnosis not present

## 2023-08-23 DIAGNOSIS — F430 Acute stress reaction: Secondary | ICD-10-CM | POA: Diagnosis not present

## 2023-09-23 DIAGNOSIS — F430 Acute stress reaction: Secondary | ICD-10-CM | POA: Diagnosis not present

## 2023-10-03 DIAGNOSIS — E612 Magnesium deficiency: Secondary | ICD-10-CM | POA: Diagnosis not present

## 2023-10-03 DIAGNOSIS — K5981 Ogilvie syndrome: Secondary | ICD-10-CM | POA: Diagnosis not present

## 2023-10-03 DIAGNOSIS — B488 Other specified mycoses: Secondary | ICD-10-CM | POA: Diagnosis not present

## 2023-10-03 DIAGNOSIS — R7303 Prediabetes: Secondary | ICD-10-CM | POA: Diagnosis not present

## 2023-10-03 DIAGNOSIS — A6920 Lyme disease, unspecified: Secondary | ICD-10-CM | POA: Diagnosis not present

## 2023-10-03 DIAGNOSIS — E785 Hyperlipidemia, unspecified: Secondary | ICD-10-CM | POA: Diagnosis not present

## 2023-10-03 DIAGNOSIS — E538 Deficiency of other specified B group vitamins: Secondary | ICD-10-CM | POA: Diagnosis not present

## 2023-10-03 DIAGNOSIS — D513 Other dietary vitamin B12 deficiency anemia: Secondary | ICD-10-CM | POA: Diagnosis not present

## 2023-10-03 DIAGNOSIS — E611 Iron deficiency: Secondary | ICD-10-CM | POA: Diagnosis not present

## 2023-10-03 DIAGNOSIS — E039 Hypothyroidism, unspecified: Secondary | ICD-10-CM | POA: Diagnosis not present

## 2023-10-05 DIAGNOSIS — F430 Acute stress reaction: Secondary | ICD-10-CM | POA: Diagnosis not present

## 2023-10-19 DIAGNOSIS — F430 Acute stress reaction: Secondary | ICD-10-CM | POA: Diagnosis not present

## 2023-10-26 DIAGNOSIS — F430 Acute stress reaction: Secondary | ICD-10-CM | POA: Diagnosis not present

## 2023-11-14 DIAGNOSIS — F430 Acute stress reaction: Secondary | ICD-10-CM | POA: Diagnosis not present

## 2023-11-23 DIAGNOSIS — F430 Acute stress reaction: Secondary | ICD-10-CM | POA: Diagnosis not present

## 2023-11-30 DIAGNOSIS — F430 Acute stress reaction: Secondary | ICD-10-CM | POA: Diagnosis not present

## 2023-12-14 DIAGNOSIS — F430 Acute stress reaction: Secondary | ICD-10-CM | POA: Diagnosis not present

## 2023-12-21 DIAGNOSIS — F430 Acute stress reaction: Secondary | ICD-10-CM | POA: Diagnosis not present

## 2024-01-06 DIAGNOSIS — F430 Acute stress reaction: Secondary | ICD-10-CM | POA: Diagnosis not present

## 2024-01-10 ENCOUNTER — Other Ambulatory Visit (HOSPITAL_BASED_OUTPATIENT_CLINIC_OR_DEPARTMENT_OTHER): Payer: Self-pay

## 2024-01-20 DIAGNOSIS — F430 Acute stress reaction: Secondary | ICD-10-CM | POA: Diagnosis not present

## 2024-01-27 DIAGNOSIS — F430 Acute stress reaction: Secondary | ICD-10-CM | POA: Diagnosis not present

## 2024-02-03 DIAGNOSIS — F430 Acute stress reaction: Secondary | ICD-10-CM | POA: Diagnosis not present

## 2024-02-10 DIAGNOSIS — G4733 Obstructive sleep apnea (adult) (pediatric): Secondary | ICD-10-CM | POA: Diagnosis not present

## 2024-02-10 DIAGNOSIS — F430 Acute stress reaction: Secondary | ICD-10-CM | POA: Diagnosis not present

## 2024-02-21 DIAGNOSIS — F430 Acute stress reaction: Secondary | ICD-10-CM | POA: Diagnosis not present

## 2024-03-09 DIAGNOSIS — F430 Acute stress reaction: Secondary | ICD-10-CM | POA: Diagnosis not present

## 2024-03-12 DIAGNOSIS — G4733 Obstructive sleep apnea (adult) (pediatric): Secondary | ICD-10-CM | POA: Diagnosis not present

## 2024-03-16 DIAGNOSIS — F430 Acute stress reaction: Secondary | ICD-10-CM | POA: Diagnosis not present

## 2024-03-23 DIAGNOSIS — F430 Acute stress reaction: Secondary | ICD-10-CM | POA: Diagnosis not present

## 2024-04-06 DIAGNOSIS — F430 Acute stress reaction: Secondary | ICD-10-CM | POA: Diagnosis not present

## 2024-04-11 DIAGNOSIS — G4733 Obstructive sleep apnea (adult) (pediatric): Secondary | ICD-10-CM | POA: Diagnosis not present

## 2024-04-17 DIAGNOSIS — F430 Acute stress reaction: Secondary | ICD-10-CM | POA: Diagnosis not present

## 2024-05-11 DIAGNOSIS — F430 Acute stress reaction: Secondary | ICD-10-CM | POA: Diagnosis not present

## 2024-05-25 DIAGNOSIS — F430 Acute stress reaction: Secondary | ICD-10-CM | POA: Diagnosis not present

## 2024-06-05 DIAGNOSIS — F430 Acute stress reaction: Secondary | ICD-10-CM | POA: Diagnosis not present

## 2024-06-21 DIAGNOSIS — F430 Acute stress reaction: Secondary | ICD-10-CM | POA: Diagnosis not present

## 2024-06-28 DIAGNOSIS — F430 Acute stress reaction: Secondary | ICD-10-CM | POA: Diagnosis not present

## 2024-07-09 DIAGNOSIS — G4733 Obstructive sleep apnea (adult) (pediatric): Secondary | ICD-10-CM | POA: Diagnosis not present

## 2024-07-12 DIAGNOSIS — F430 Acute stress reaction: Secondary | ICD-10-CM | POA: Diagnosis not present

## 2024-07-17 DIAGNOSIS — Z01419 Encounter for gynecological examination (general) (routine) without abnormal findings: Secondary | ICD-10-CM | POA: Diagnosis not present

## 2024-07-19 DIAGNOSIS — F430 Acute stress reaction: Secondary | ICD-10-CM | POA: Diagnosis not present

## 2024-07-26 DIAGNOSIS — F430 Acute stress reaction: Secondary | ICD-10-CM | POA: Diagnosis not present

## 2024-08-02 DIAGNOSIS — F430 Acute stress reaction: Secondary | ICD-10-CM | POA: Diagnosis not present

## 2024-08-09 DIAGNOSIS — F430 Acute stress reaction: Secondary | ICD-10-CM | POA: Diagnosis not present

## 2024-08-16 DIAGNOSIS — F430 Acute stress reaction: Secondary | ICD-10-CM | POA: Diagnosis not present

## 2024-08-23 DIAGNOSIS — F430 Acute stress reaction: Secondary | ICD-10-CM | POA: Diagnosis not present

## 2024-08-30 DIAGNOSIS — F430 Acute stress reaction: Secondary | ICD-10-CM | POA: Diagnosis not present

## 2024-09-06 DIAGNOSIS — F430 Acute stress reaction: Secondary | ICD-10-CM | POA: Diagnosis not present
